# Patient Record
Sex: Female | Born: 1980 | Race: Black or African American | Hispanic: No | Marital: Married | Smoking: Never smoker
Health system: Southern US, Community
[De-identification: ages and names within clinical notes are randomized; demographics above are authoritative.]

## PROBLEM LIST (undated history)

## (undated) ENCOUNTER — Inpatient Hospital Stay (HOSPITAL_COMMUNITY): Payer: Self-pay

## (undated) DIAGNOSIS — I951 Orthostatic hypotension: Secondary | ICD-10-CM

## (undated) DIAGNOSIS — Z9289 Personal history of other medical treatment: Secondary | ICD-10-CM

## (undated) DIAGNOSIS — R000 Tachycardia, unspecified: Secondary | ICD-10-CM

## (undated) DIAGNOSIS — G90A Postural orthostatic tachycardia syndrome (POTS): Secondary | ICD-10-CM

## (undated) DIAGNOSIS — L309 Dermatitis, unspecified: Secondary | ICD-10-CM

## (undated) DIAGNOSIS — I498 Other specified cardiac arrhythmias: Secondary | ICD-10-CM

## (undated) HISTORY — DX: Tachycardia, unspecified: R00.0

## (undated) HISTORY — DX: Other specified cardiac arrhythmias: I49.8

## (undated) HISTORY — DX: Orthostatic hypotension: I95.1

## (undated) HISTORY — DX: Postural orthostatic tachycardia syndrome (POTS): G90.A

## (undated) HISTORY — DX: Dermatitis, unspecified: L30.9

## (undated) HISTORY — PX: OTHER SURGICAL HISTORY: SHX169

## (undated) HISTORY — PX: LAPAROTOMY: SHX154

## (undated) HISTORY — DX: Personal history of other medical treatment: Z92.89

## (undated) HISTORY — PX: POLYPECTOMY: SHX149

## (undated) HISTORY — PX: APPENDECTOMY: SHX54

---

## 2010-11-04 ENCOUNTER — Ambulatory Visit (INDEPENDENT_AMBULATORY_CARE_PROVIDER_SITE_OTHER): Payer: Self-pay | Admitting: General Surgery

## 2010-11-11 ENCOUNTER — Ambulatory Visit: Payer: Self-pay | Admitting: Internal Medicine

## 2010-11-15 ENCOUNTER — Encounter: Payer: Self-pay | Admitting: *Deleted

## 2010-11-18 ENCOUNTER — Ambulatory Visit (INDEPENDENT_AMBULATORY_CARE_PROVIDER_SITE_OTHER): Payer: BC Managed Care – PPO | Admitting: Internal Medicine

## 2010-11-18 ENCOUNTER — Encounter: Payer: Self-pay | Admitting: Internal Medicine

## 2010-11-18 DIAGNOSIS — G901 Familial dysautonomia [Riley-Day]: Secondary | ICD-10-CM

## 2010-11-18 DIAGNOSIS — R002 Palpitations: Secondary | ICD-10-CM | POA: Insufficient documentation

## 2010-11-18 DIAGNOSIS — G9009 Other idiopathic peripheral autonomic neuropathy: Secondary | ICD-10-CM

## 2010-11-18 DIAGNOSIS — G909 Disorder of the autonomic nervous system, unspecified: Secondary | ICD-10-CM

## 2010-11-18 DIAGNOSIS — R000 Tachycardia, unspecified: Secondary | ICD-10-CM

## 2010-11-18 NOTE — Assessment & Plan Note (Signed)
Mrs. 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 The fact that they are reproducibly induced with exercise makes a reentrant circuit much less likely and the fact that last 30 or 40 minutes and gradually slows also makes that unlikely. Given the fact that it is  reproducibly however inducible with exercise I think that this type of provocation is our  most expeditious diagnostic tool.  We spent a great deal of time discussing the physiology of this autonomic syndromes. Her orthostatic vital signs are not strikingly abnormal are strongly suggestive of dysautonomia and they may be mitigated by the fact that she is wearing heels.  She is advised to increase her salt intake, increase her fluid intake, and try to refrain from caffeine. We did note that her diastolic blood pressure was mildly elevated today

## 2010-11-18 NOTE — Assessment & Plan Note (Addendum)
As above  We will submit her for GXT and utilizing an event recorder if necessary

## 2010-11-18 NOTE — Progress Notes (Signed)
HPI: Rose Bennett is a 30 y.o. female Seen at the request of Dr. Paulino Rily because of exercise associated palpitations.  She is a 2-3 year history where she has noted palpitations during events of exercise as well as "stress" ie giving presentations at work. Her heart rates and recorded in the 170-80 range. This is now a reproducible event and induces her characteristic tachypalpitations.  She has some orthostatic intolerance. She has pool intolerance. She does not have shower intolerance. She is taking hormonal therapy to suppress Menses because of endometriosis.  Her diet is salt complete and volume status  are stable Current Outpatient Prescriptions  Medication Sig Dispense Refill  . ibuprofen (ADVIL,MOTRIN) 800 MG tablet Take 800 mg by mouth every 8 (eight) hours as needed.        . lactulose (CHRONULAC) 10 GM/15ML solution Take 20 g by mouth as needed.        Marland Kitchen letrozole (FEMARA) 2.5 MG tablet Take 2.5 mg by mouth daily.        . norethindrone (AYGESTIN) 5 MG tablet Take 5 mg by mouth daily.          Allergies  Allergen Reactions  . Heparin     Past Medical History  Diagnosis Date  . Tachycardia   . Dermatitis   . Lipoma     No past surgical history on file.  No family history on file.  History   Social History  . Marital Status: Married    Spouse Name: N/A    Number of Children: N/A  . Years of Education: N/A   Occupational History  . Not on file.   Social History Main Topics  . Smoking status: Never Smoker   . Smokeless tobacco: Not on file  . Alcohol Use: Yes     occasional  . Drug Use: No  . Sexually Active: Not on file   Other Topics Concern  . Not on file   Social History Narrative  . No narrative on file    Fourteen point review of systems was negative except as noted in HPI and PMH   PHYSICAL EXAMINATION  Blood pressure 100/60, pulse 80, resp. rate 18, height 5\' 2"  (1.575 m), weight 147 lb (66.679 kg).   Well developed and nourished [place] female appearing her stated age in no acute distress HENT normal Neck supple with JVP-flat Carotids brisk and full without bruits Back without scoliosis or kyphosis Clear Regular rate and rhythm, no murmurs or gallops Abd-soft with active BS without hepatomegaly or midline pulsation Femoral pulses 2+ distal pulses intact No Clubbing cyanosis edema Skin-warm and dry LN-neg submandibular and supraclavicular A & Oriented CN 3-12 normal  Grossly normal sensory and motor function Affect engaging . Sinus rhythm at 80 Intervals 0.13/07/0.34 Axis is 67

## 2010-11-18 NOTE — Patient Instructions (Signed)
Your physician has requested that you have an exercise tolerance test. For further information please visit [email or web address] Please also follow instruction sheet, as given.  Increase sodium and water intake.

## 2010-11-20 ENCOUNTER — Encounter (INDEPENDENT_AMBULATORY_CARE_PROVIDER_SITE_OTHER): Payer: Self-pay

## 2010-11-21 ENCOUNTER — Ambulatory Visit (INDEPENDENT_AMBULATORY_CARE_PROVIDER_SITE_OTHER): Payer: BC Managed Care – PPO | Admitting: General Surgery

## 2010-11-29 ENCOUNTER — Encounter: Payer: Self-pay | Admitting: Physician Assistant

## 2010-12-02 ENCOUNTER — Encounter (INDEPENDENT_AMBULATORY_CARE_PROVIDER_SITE_OTHER): Payer: Self-pay | Admitting: General Surgery

## 2010-12-02 ENCOUNTER — Ambulatory Visit (INDEPENDENT_AMBULATORY_CARE_PROVIDER_SITE_OTHER): Payer: BC Managed Care – PPO | Admitting: General Surgery

## 2010-12-02 VITALS — BP 118/82 | HR 96 | Temp 97.6°F | Ht 62.0 in | Wt 148.2 lb

## 2010-12-02 DIAGNOSIS — D1720 Benign lipomatous neoplasm of skin and subcutaneous tissue of unspecified limb: Secondary | ICD-10-CM

## 2010-12-02 DIAGNOSIS — D1739 Benign lipomatous neoplasm of skin and subcutaneous tissue of other sites: Secondary | ICD-10-CM

## 2010-12-02 NOTE — Progress Notes (Signed)
Subjective:     Patient ID: Rose Bennett, female   DOB: August 03, 1980, 30 y.o.   MRN: [id number]  HPI We are asked to see the patient in consultation by Dr. Mila Palmer To evaluate her for a lipoma on her left shoulder. The patient is a 30 year old black female who has noticed a lump on her left posterior shoulder/axillary area for a number of years. Over the last 6 she has noticed it getting larger. She has had some soreness associated  with it if her broad strap rubs on it or if she leans against something. She denies any fevers or chills. No chest pain or shortness of breath.  Review of Systems  Constitutional: Negative.   HENT: Negative.   Eyes: Negative.   Respiratory: Negative.   Cardiovascular: Negative.   Gastrointestinal: Negative.   Genitourinary: Negative.   Musculoskeletal: Negative.   Skin: Negative.   Neurological: Negative.   Hematological: Negative.   Psychiatric/Behavioral: Negative.    Past Medical History  Diagnosis Date  . Tachycardia   . Dermatitis   . Lipoma   . Endometriosis   . POTS (postural orthostatic tachycardia syndrome)    Past Surgical History  Procedure Date  . Laparotomy     x5  . Appendectomy   . Polypectomy     ovarian   Current outpatient prescriptions:cetirizine (ZYRTEC) 10 MG tablet, Take 10 mg by mouth daily.  , Disp: , Rfl: ;  clobetasol (TEMOVATE) 0.05 % cream, Apply 1 application topically 2 (two) times daily.  , Disp: , Rfl: ;  Cyanocobalamin (B-12 PO), Take by mouth daily.  , Disp: , Rfl: ;  diphenhydrAMINE (BENADRYL) 25 MG tablet, Take 25 mg by mouth every 6 (six) hours as needed.  , Disp: , Rfl:  Ferrous Sulfate (IRON CR PO), Take by mouth.  , Disp: , Rfl: ;  ibuprofen (ADVIL,MOTRIN) 800 MG tablet, Take 800 mg by mouth every 8 (eight) hours as needed.  , Disp: , Rfl: ;  lactulose (CHRONULAC) 10 GM/15ML solution, Take 20 g by mouth as needed.  , Disp: , Rfl: ;  letrozole (FEMARA) 2.5 MG tablet, Take 2.5 mg by mouth daily.  , Disp: ,  Rfl: ;  Multiple Vitamin (MULTIVITAMIN) capsule, Take 1 capsule by mouth daily.  , Disp: , Rfl:  norethindrone (AYGESTIN) 5 MG tablet, Take 5 mg by mouth daily.  , Disp: , Rfl: ;  oxyCODONE-acetaminophen (PERCOCET) 5-325 MG per tablet, Take 1 tablet by mouth every 4 (four) hours as needed.  , Disp: , Rfl:   Allergies  Allergen Reactions  . Heparin   Dermabond      Objective:   Physical Exam  Constitutional: She is oriented to person, place, and time. She appears well-developed and well-nourished.  HENT:  Head: Normocephalic and atraumatic.  Eyes: Conjunctivae and EOM are normal. Pupils are equal, round, and reactive to light.  Neck: Normal range of motion. Neck supple.  Cardiovascular: Normal rate, regular rhythm and normal heart sounds.   Pulmonary/Chest: Effort normal and breath sounds normal.       On her left posterior shoulder/axillary area she has about a 6 cm fatty tumor that seems to be isolated to the subcutaneous tissue  Abdominal: Soft. Bowel sounds are normal.  Musculoskeletal: Normal range of motion.  Neurological: She is alert and oriented to person, place, and time.  Skin: Skin is warm and dry.  Psychiatric: She has a normal mood and affect. Her behavior is normal.  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 I have discussed her in detail the risks and benefits of the operation remove the mass as well as some of the technical aspects and she understands and wishes to proceed.

## 2010-12-03 ENCOUNTER — Encounter (INDEPENDENT_AMBULATORY_CARE_PROVIDER_SITE_OTHER): Payer: BC Managed Care – PPO

## 2010-12-03 ENCOUNTER — Ambulatory Visit (INDEPENDENT_AMBULATORY_CARE_PROVIDER_SITE_OTHER): Payer: BC Managed Care – PPO | Admitting: Physician Assistant

## 2010-12-03 DIAGNOSIS — R000 Tachycardia, unspecified: Secondary | ICD-10-CM

## 2010-12-03 DIAGNOSIS — G9009 Other idiopathic peripheral autonomic neuropathy: Secondary | ICD-10-CM

## 2010-12-03 DIAGNOSIS — I471 Supraventricular tachycardia: Secondary | ICD-10-CM

## 2010-12-03 NOTE — Progress Notes (Addendum)
Exercise Treadmill Test  Pre-Exercise Testing Evaluation Rhythm: sinus tachycardia  Rate: 105   PR:  .12 QRS:  .08  QT:  .36 QTc: .41     Test  Exercise Tolerance Test Ordering MD: Sherryl Manges, MD  Interpreting MD:  Tereso Newcomer PA-C  Unique Test No: 1  Treadmill:  1  Indication for ETT: chest pain - rule out ischemia  Contraindication to ETT: No   Stress Modality: exercise - treadmill  Cardiac Imaging Performed: non   Protocol: standard Bruce - maximal  Max BP:  179/82  Max MPHR (bpm):  191 85% MPR (bpm):  162  MPHR obtained (bpm):  203 % MPHR obtained:  108  Reached 85% MPHR (min:sec): 3:00 Total Exercise Time (min-sec):  6:36  Workload in METS:  10.8 Borg Scale: 13  Reason ETT Terminated:  desired heart rate attained    ST Segment Analysis At Rest: normal ST segments - no evidence of significant ST depression With Exercise: no evidence of significant ST depression  Other Information Arrhythmia:  No Angina during ETT:  absent (0) Quality of ETT:  diagnostic  ETT Interpretation:  normal - no evidence of ischemia by ST analysis  Comments: Fair exercise tolerance. No chest pain. Normal BP response to exercise. No ST-T changes to suggest ischemia. No supraventricular or ventricular arrhythmias noted during exercise.  Recommendations: Follow up with Dr. Graciela Husbands as directed.  I was called back to the stress room after finishing.  The patient became near syncopal when getting up to leave.  She had associated nausea and chest pain.  No ECG was available during her episode.  She quickly felt back to normal.  These were the same symptoms she has been having that prompted her visit to Dr. Graciela Husbands.  There was nothing different about this.  She was given some crackers and juice and felt back to normal when she left.  BP was 118/80 and HR 110 when I evaluated her.  Cardiac exam was regular.  She had normal appearance and her skin was warm and dry.  I reviewed her notes and had her set up  with an event monitor today.  She was discharged to home in stable condition.

## 2010-12-24 ENCOUNTER — Ambulatory Visit (INDEPENDENT_AMBULATORY_CARE_PROVIDER_SITE_OTHER): Payer: BC Managed Care – PPO | Admitting: Internal Medicine

## 2010-12-24 ENCOUNTER — Encounter: Payer: Self-pay | Admitting: Internal Medicine

## 2010-12-24 VITALS — BP 117/75 | HR 88 | Ht 62.0 in | Wt 150.0 lb

## 2010-12-24 DIAGNOSIS — G909 Disorder of the autonomic nervous system, unspecified: Secondary | ICD-10-CM

## 2010-12-24 DIAGNOSIS — R002 Palpitations: Secondary | ICD-10-CM

## 2010-12-24 DIAGNOSIS — G901 Familial dysautonomia [Riley-Day]: Secondary | ICD-10-CM

## 2010-12-24 MED ORDER — ATENOLOL 25 MG PO TABS: 25.0000 mg | ORAL_TABLET | Freq: Every day | ORAL | Status: DC

## 2010-12-24 MED ORDER — METOPROLOL SUCCINATE ER 25 MG PO TB24: 25.0000 mg | ORAL_TABLET | Freq: Every day | ORAL | Status: DC

## 2010-12-24 MED ORDER — NADOLOL 20 MG PO TABS: 20.0000 mg | ORAL_TABLET | Freq: Every day | ORAL | Status: DC

## 2010-12-24 NOTE — Progress Notes (Signed)
HPI: Rose Bennett is a 30 y.o. female Seen seen in followup for exercise associated palpitations and post exercise syncope.  Since last having been seen, was admitted for treadmill testing with appropriate exercise response. Unfortunately I do not have the interval exercise data to look at rate of heart rate increase. After the exercise while she was standing having her leads removed she became presyncopal.  She is also exercising at the [employer]" she had an episode of post exercise syncope. Her event recorder that she has been using has demonstrated sinus tachycardia associated with nausea diaphoresis and lightheadedness   She has some orthostatic intolerance. She has pool intolerance. She does not have shower intolerance. She is taking hormonal therapy to suppress Menses because of endometriosis.  Her diet is salt complete and volume status  are stable Current Outpatient Prescriptions  Medication Sig Dispense Refill  . cetirizine (ZYRTEC) 10 MG tablet Take 10 mg by mouth daily.        . clobetasol (TEMOVATE) 0.05 % cream Apply 1 application topically 2 (two) times daily.        . Cyanocobalamin (B-12 PO) Take by mouth daily.        . diphenhydrAMINE (BENADRYL) 25 MG tablet Take 25 mg by mouth every 6 (six) hours as needed.        . Ferrous Sulfate (IRON CR PO) Take by mouth.        Marland Kitchen ibuprofen (ADVIL,MOTRIN) 800 MG tablet Take 800 mg by mouth every 8 (eight) hours as needed.        . lactulose (CHRONULAC) 10 GM/15ML solution Take 20 g by mouth as needed.        Marland Kitchen letrozole (FEMARA) 2.5 MG tablet Take 2.5 mg by mouth daily.        . Multiple Vitamin (MULTIVITAMIN) capsule Take 1 capsule by mouth daily.        . norethindrone (AYGESTIN) 5 MG tablet Take 5 mg by mouth daily.        Marland Kitchen oxyCODONE-acetaminophen (PERCOCET) 5-325 MG per tablet Take 1 tablet by mouth every 4 (four) hours as needed.          Allergies  Allergen Reactions  . Heparin     Past Medical History  Diagnosis  Date  . Tachycardia   . Dermatitis   . Lipoma   . Endometriosis   . POTS (postural orthostatic tachycardia syndrome)     Past Surgical History  Procedure Date  . Laparotomy     x5  . Appendectomy   . Polypectomy     ovarian    Family History  Problem Relation Age of Onset  . Heart disease Mother   . Heart disease Sister     History   Social History  . Marital Status: Married    Spouse Name: N/A    Number of Children: N/A  . Years of Education: N/A   Occupational History  . Not on file.   Social History Main Topics  . Smoking status: Never Smoker   . Smokeless tobacco: Not on file  . Alcohol Use: Yes     occasional  . Drug Use: No  . Sexually Active: Not on file   Other Topics Concern  . Not on file   Social History Narrative  . No narrative on file    Fourteen point review of systems was negative except as noted in HPI and PMH   PHYSICAL EXAMINATION  Height 5\' 2"  (1.575 m), weight 150 lb (  68.04 kg).   Well developed and nourished [place] American female appearing her stated age in no acute distress HENT normal Neck supple with JVP-flat Carotids brisk and full without bruits Back without scoliosis or kyphosis Clear Regular rate and rhythm, no murmurs or gallops Abd-soft with active BS without hepatomegaly or midline pulsation Femoral pulses 2+ distal pulses intact No Clubbing cyanosis edema Skin-warm and dry LN-neg submandibular and supraclavicular A & Oriented CN 3-12 normal  Grossly normal sensory and motor function Affect engaging . Sinus rhythm at 80 Intervals 0.13/07/0.34 Axis is 67

## 2010-12-24 NOTE — Patient Instructions (Addendum)
You are being given prescriptions for 3 different beta blockers to try. You may use them in any order, but DO NOT take all 3 of them at the same- 1 at a time only. You can use these in 2 week intervals to see which one you like the best. Once you decide what works best for you, then you can contact us and we can get a long term prescription in for you. You have been prescribed: 1) Atenolol 25mg  once daily. 2) Metoprolol succ 25mg  once daily. 3) Nadolol 20mg  once daily.  Your physician recommends that you schedule a follow-up appointment in: 3 months.

## 2010-12-24 NOTE — Assessment & Plan Note (Addendum)
We want to prescribe low-dose beta blockers to see if we can't temper her heart rate response to exercise. Will also have recommended from a tabs as a way to further replete her salt.  We discussed the potential role of recumbent exercise as he preferred alternative inpatient for dysautonomic

## 2011-01-17 DIAGNOSIS — D1739 Benign lipomatous neoplasm of skin and subcutaneous tissue of other sites: Secondary | ICD-10-CM

## 2011-03-20 ENCOUNTER — Other Ambulatory Visit: Payer: Self-pay | Admitting: Internal Medicine

## 2011-03-20 DIAGNOSIS — G901 Familial dysautonomia [Riley-Day]: Secondary | ICD-10-CM

## 2011-03-20 MED ORDER — METOPROLOL SUCCINATE ER 25 MG PO TB24: 25.0000 mg | ORAL_TABLET | Freq: Every day | ORAL | Status: DC

## 2011-03-20 MED ORDER — NADOLOL 20 MG PO TABS: 20.0000 mg | ORAL_TABLET | Freq: Every day | ORAL | Status: DC

## 2011-03-20 MED ORDER — ATENOLOL 25 MG PO TABS: 25.0000 mg | ORAL_TABLET | Freq: Every day | ORAL | Status: DC

## 2011-03-26 ENCOUNTER — Ambulatory Visit: Payer: BC Managed Care – PPO | Admitting: Internal Medicine

## 2011-04-29 ENCOUNTER — Encounter: Payer: Self-pay | Admitting: Internal Medicine

## 2011-07-07 ENCOUNTER — Other Ambulatory Visit (HOSPITAL_COMMUNITY): Payer: Self-pay | Admitting: Obstetrics and Gynecology

## 2011-07-07 DIAGNOSIS — Z3141 Encounter for fertility testing: Secondary | ICD-10-CM

## 2011-09-05 ENCOUNTER — Ambulatory Visit (HOSPITAL_COMMUNITY)
Admission: RE | Admit: 2011-09-05 | Discharge: 2011-09-05 | Disposition: A | Discharge status: Home / Self Care | Payer: BC Managed Care – PPO | Source: Ambulatory Visit | Attending: Obstetrics and Gynecology | Admitting: Obstetrics and Gynecology

## 2011-09-05 DIAGNOSIS — N979 Female infertility, unspecified: Secondary | ICD-10-CM | POA: Insufficient documentation

## 2011-09-05 DIAGNOSIS — Z3141 Encounter for fertility testing: Secondary | ICD-10-CM

## 2011-09-05 MED ORDER — IOHEXOL 300 MG/ML  SOLN
8.0000 mL | Freq: Once | INTRAMUSCULAR | Status: AC | PRN
  Administered 2011-09-05: 8 mL

## 2012-03-22 ENCOUNTER — Ambulatory Visit (HOSPITAL_COMMUNITY): Payer: BC Managed Care – PPO | Attending: Cardiology

## 2012-03-22 ENCOUNTER — Ambulatory Visit (INDEPENDENT_AMBULATORY_CARE_PROVIDER_SITE_OTHER): Payer: BC Managed Care – PPO | Admitting: Physician Assistant

## 2012-03-22 ENCOUNTER — Encounter: Payer: Self-pay | Admitting: Physician Assistant

## 2012-03-22 VITALS — BP 110/78 | HR 93 | Ht 62.0 in | Wt 159.8 lb

## 2012-03-22 DIAGNOSIS — R072 Precordial pain: Secondary | ICD-10-CM

## 2012-03-22 DIAGNOSIS — R5383 Other fatigue: Secondary | ICD-10-CM

## 2012-03-22 DIAGNOSIS — R5381 Other malaise: Secondary | ICD-10-CM | POA: Insufficient documentation

## 2012-03-22 DIAGNOSIS — R0602 Shortness of breath: Secondary | ICD-10-CM

## 2012-03-22 DIAGNOSIS — R079 Chest pain, unspecified: Secondary | ICD-10-CM

## 2012-03-22 DIAGNOSIS — R002 Palpitations: Secondary | ICD-10-CM | POA: Insufficient documentation

## 2012-03-22 DIAGNOSIS — R0609 Other forms of dyspnea: Secondary | ICD-10-CM | POA: Insufficient documentation

## 2012-03-22 DIAGNOSIS — R0989 Other specified symptoms and signs involving the circulatory and respiratory systems: Secondary | ICD-10-CM | POA: Insufficient documentation

## 2012-03-22 LAB — CBC WITH DIFFERENTIAL/PLATELET
Basophils Relative: 0.3 % (ref 0.0–3.0)
Eosinophils Absolute: 0 10*3/uL (ref 0.0–0.7)
Eosinophils Relative: 0.7 % (ref 0.0–5.0)
HCT: 41.4 % (ref 36.0–46.0)
Hemoglobin: 13.4 g/dL (ref 12.0–15.0)
MCHC: 32.4 g/dL (ref 30.0–36.0)
MCV: 85 fl (ref 78.0–100.0)
Monocytes Absolute: 0.3 10*3/uL (ref 0.1–1.0)
Neutro Abs: 3.8 10*3/uL (ref 1.4–7.7)
Neutrophils Relative %: 61.2 % (ref 43.0–77.0)
RBC: 4.87 Mil/uL (ref 3.87–5.11)
WBC: 6.2 10*3/uL (ref 4.5–10.5)

## 2012-03-22 LAB — BASIC METABOLIC PANEL
CO2: 24 mEq/L (ref 19–32)
Chloride: 105 mEq/L (ref 96–112)
Potassium: 3.8 mEq/L (ref 3.5–5.1)
Sodium: 137 mEq/L (ref 135–145)

## 2012-03-22 LAB — D-DIMER, QUANTITATIVE: D-Dimer, Quant: <0.27 ug/mL-FEU (ref 0.00–0.48)

## 2012-03-22 NOTE — Patient Instructions (Addendum)
Your physician recommends that you return for lab work in: TODAY BMET, CBC W/DIFF, TSH,  AND STAT D-DIMER  Your physician has requested that you have an echocardiogram DX 786.50, 786.05. Echocardiography is a painless test that uses sound waves to create images of your heart. It provides your doctor with information about the size and shape of your heart and how well your heart's chambers and valves are working. This procedure takes approximately one hour. There are no restrictions for this procedure.  Your physician has requested that you have a stress echocardiogram DX 786.50, 786.05. For further information please visit [email or web address] Please follow instruction sheet as given.  Your physician recommends that you schedule a follow-up appointment in: 1 MONTH WITH DR. Graciela Husbands

## 2012-03-22 NOTE — Progress Notes (Signed)
Echocardiogram performed.  
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
## 2012-03-23 ENCOUNTER — Encounter: Payer: Self-pay | Admitting: Physician Assistant

## 2012-03-23 ENCOUNTER — Telehealth: Payer: Self-pay | Admitting: *Deleted

## 2012-03-23 ENCOUNTER — Other Ambulatory Visit (HOSPITAL_COMMUNITY): Payer: BC Managed Care – PPO

## 2012-03-23 ENCOUNTER — Other Ambulatory Visit: Payer: Self-pay | Admitting: *Deleted

## 2012-03-23 DIAGNOSIS — R0602 Shortness of breath: Secondary | ICD-10-CM

## 2012-03-23 DIAGNOSIS — R079 Chest pain, unspecified: Secondary | ICD-10-CM

## 2012-03-23 DIAGNOSIS — R5383 Other fatigue: Secondary | ICD-10-CM

## 2012-03-23 NOTE — Telephone Encounter (Signed)
lmom echo and labs ok,

## 2012-03-23 NOTE — Telephone Encounter (Signed)
Left message for patient to call and schedule GXT per scott.

## 2012-03-23 NOTE — Telephone Encounter (Signed)
Message copied by Tarri Fuller on Tue Mar 23, 2012  9:53 AM ------      Message from: [place], [place] T      Created: Tue Mar 23, 2012  8:14 AM       Echo ok with      Normal LV function.      Tereso Newcomer, PA-C 03/23/2012 8:14 AM

## 2012-04-12 ENCOUNTER — Encounter: Payer: BC Managed Care – PPO | Admitting: Physician Assistant

## 2012-04-16 ENCOUNTER — Telehealth: Payer: Self-pay | Admitting: *Deleted

## 2012-04-16 NOTE — Telephone Encounter (Signed)
Pt cancel GXT

## 2012-04-16 NOTE — Telephone Encounter (Signed)
Rose Bennett was a no-show for her treadmill appointment on 04/12/12 ordered by Tereso Newcomer PA. Left message on voice mail for patient to call and reschedule.

## 2012-05-18 ENCOUNTER — Ambulatory Visit: Payer: BC Managed Care – PPO | Admitting: Internal Medicine

## 2012-06-23 ENCOUNTER — Telehealth: Payer: Self-pay | Admitting: Internal Medicine

## 2012-06-23 DIAGNOSIS — G901 Familial dysautonomia [Riley-Day]: Secondary | ICD-10-CM

## 2012-06-23 MED ORDER — NADOLOL 20 MG PO TABS: 20.0000 mg | ORAL_TABLET | Freq: Every day | ORAL | Status: DC

## 2012-06-23 NOTE — Telephone Encounter (Signed)
Verifying with patient that beta blocker is Nadolol 20 once daily.  Refill sent to [facility] [address]  [phone], 3 refills   Micki Riley, [place]

## 2012-06-23 NOTE — Telephone Encounter (Signed)
Pt needs refill asap completely out of all three beta blockers, no refills left, uses [facility] [address]

## 2012-10-28 ENCOUNTER — Emergency Department (INDEPENDENT_AMBULATORY_CARE_PROVIDER_SITE_OTHER)
Admission: EM | Admit: 2012-10-28 | Discharge: 2012-10-28 | Disposition: A | Discharge status: Home / Self Care | Payer: BC Managed Care – PPO | Source: Home / Self Care | Attending: Family Medicine | Admitting: Family Medicine

## 2012-10-28 ENCOUNTER — Other Ambulatory Visit (HOSPITAL_COMMUNITY)
Admission: RE | Admit: 2012-10-28 | Discharge: 2012-10-28 | Disposition: A | Discharge status: Home / Self Care | Payer: BC Managed Care – PPO | Source: Ambulatory Visit | Attending: Family Medicine | Admitting: Family Medicine

## 2012-10-28 ENCOUNTER — Encounter (HOSPITAL_COMMUNITY): Payer: Self-pay | Admitting: Emergency Medicine

## 2012-10-28 DIAGNOSIS — R42 Dizziness and giddiness: Secondary | ICD-10-CM

## 2012-10-28 DIAGNOSIS — Z113 Encounter for screening for infections with a predominantly sexual mode of transmission: Secondary | ICD-10-CM | POA: Insufficient documentation

## 2012-10-28 DIAGNOSIS — N898 Other specified noninflammatory disorders of vagina: Secondary | ICD-10-CM

## 2012-10-28 DIAGNOSIS — N939 Abnormal uterine and vaginal bleeding, unspecified: Secondary | ICD-10-CM

## 2012-10-28 DIAGNOSIS — N760 Acute vaginitis: Secondary | ICD-10-CM | POA: Insufficient documentation

## 2012-10-28 LAB — POCT URINALYSIS DIP (DEVICE)
Bilirubin Urine: NEGATIVE
Glucose, UA: NEGATIVE mg/dL
Specific Gravity, Urine: >=1.03 (ref 1.005–1.030)
Urobilinogen, UA: 0.2 mg/dL (ref 0.0–1.0)
pH: 6 (ref 5.0–8.0)

## 2012-10-28 LAB — POCT I-STAT, CHEM 8
BUN: 11 mg/dL (ref 6–23)
Calcium, Ion: 1.21 mmol/L (ref 1.12–1.23)
Chloride: 103 mEq/L (ref 96–112)
Creatinine, Ser: 0.9 mg/dL (ref 0.50–1.10)
TCO2: 24 mmol/L (ref 0–100)

## 2012-10-28 MED ORDER — NORGESTIMATE-ETH ESTRADIOL 0.25-35 MG-MCG PO TABS: ORAL_TABLET | ORAL | Status: DC

## 2012-10-28 MED ORDER — ONDANSETRON 4 MG PO TBDP: ORAL_TABLET | ORAL | Status: DC

## 2012-10-28 MED ORDER — TRAMADOL HCL 50 MG PO TABS: 50.0000 mg | ORAL_TABLET | Freq: Four times a day (QID) | ORAL | Status: DC | PRN

## 2012-10-28 NOTE — ED Provider Notes (Signed)
History    CSN: [id number] Arrival date & time 10/28/12  1018  First MD Initiated Contact with Patient 10/28/12 1117     Chief Complaint  Patient presents with  . Vaginal Bleeding   (Consider location/radiation/quality/duration/timing/severity/associated sxs/prior Treatment) HPI Comments: 32 year old female with a history of endometriosis presents complaining of vaginal bleeding for 4 days. She was going through 2 pads and 2 tampons every hour yesterday but is down to 1 tampons and 1 pad hourly today. She says this is a mixture of dark blood and clots. She has been getting very dizzy to the point where she fainted at work yesterday. She states that she started feeling very hot and fell backward off of her chair. Since then, she has been feeling very lightheaded. She also has had a headache in the front of her head since this bleeding started and a mild soreness on the back of her head since she fell out of her chair when she fainted. She states that in the past couple of days she feels like she has to concentrate very hard while driving to keep from getting distracted and from fainting. She called her physician at [facility] at their endometriosis clinic who told her to come up to be seen to check for any significant bleeding or anemia as a result of this. She denies all  other symptoms. She has chronic lower abdominal/pelvic pain and this is unchanged. She denies chest pain, shortness of breath, blurry vision.  Patient is a 32 y.o. female presenting with vaginal bleeding.  Vaginal Bleeding Associated symptoms: dizziness and fatigue   Associated symptoms: no abdominal pain, no dysuria, no fever, no nausea and no vaginal discharge    Past Medical History  Diagnosis Date  . Tachycardia   . Dermatitis   . Lipoma   . Endometriosis   . POTS (postural orthostatic tachycardia syndrome)   . Hx of echocardiogram     a. echo 12/13:  mild LVH, EF 55%, normal diast fn   Past Surgical History  Procedure  Laterality Date  . Laparotomy      x5  . Appendectomy    . Polypectomy      ovarian   Family History  Problem Relation Age of Onset  . Heart disease Mother   . Heart disease Sister    History  Substance Use Topics  . Smoking status: Never Smoker   . Smokeless tobacco: Not on file  . Alcohol Use: Yes     Comment: occasional   OB History   Grav Para Term Preterm Abortions TAB SAB Ect Mult Living                 Review of Systems  Constitutional: Positive for fatigue. Negative for fever and chills.  Eyes: Negative for visual disturbance.  Respiratory: Negative for cough and shortness of breath.   Cardiovascular: Negative for chest pain, palpitations and leg swelling.  Gastrointestinal: Negative for nausea, vomiting and abdominal pain.  Endocrine: Negative for polydipsia and polyuria.  Genitourinary: Positive for vaginal bleeding and pelvic pain (chronic). Negative for dysuria, urgency, frequency, vaginal discharge and vaginal pain.  Musculoskeletal: Negative for myalgias and arthralgias.  Skin: Negative for rash.  Neurological: Positive for dizziness, light-headedness and headaches. Negative for weakness.    Allergies  Adhesive; Heparin; Pineapple; and Tomato  Home Medications   Current Outpatient Rx  Name  Route  Sig  Dispense  Refill  . letrozole (FEMARA) 2.5 MG tablet   Oral   Take  2.5 mg by mouth daily.           . norethindrone (AYGESTIN) 5 MG tablet   Oral   Take 5 mg by mouth daily.           . cetirizine (ZYRTEC) 10 MG tablet   Oral   Take 10 mg by mouth as needed.          . clobetasol (TEMOVATE) 0.05 % cream   Topical   Apply 1 application topically 2 (two) times daily.           . Cyanocobalamin (B-12 PO)   Oral   Take by mouth daily.           . diphenhydrAMINE (BENADRYL) 25 MG tablet   Oral   Take 25 mg by mouth every 6 (six) hours as needed.           . Ferrous Sulfate (IRON CR PO)   Oral   Take by mouth.           Marland Kitchen  ibuprofen (ADVIL,MOTRIN) 800 MG tablet   Oral   Take 800 mg by mouth every 8 (eight) hours as needed.           . lactulose (CHRONULAC) 10 GM/15ML solution   Oral   Take 20 g by mouth as needed.           . Multiple Vitamin (MULTIVITAMIN) capsule   Oral   Take 1 capsule by mouth daily.           . nadolol (CORGARD) 20 MG tablet   Oral   Take 1 tablet (20 mg total) by mouth daily.   30 tablet   3   . norgestimate-ethinyl estradiol (ORTHO-CYCLEN,SPRINTEC,PREVIFEM) 0.25-35 MG-MCG tablet      1 tablet every 6 hours today 1 tablet every 8 on day 2  1 tablet BID until bleeding stops  1 tablet per day until pack is finished  Unless otherwise directed by OBGYN physician   1 Package   11   . ondansetron (ZOFRAN-ODT) 4 MG disintegrating tablet      PRN for nausea or vomiting; take 1 tablet sublingual along with the birth control pill   30 tablet   0   . oxyCODONE-acetaminophen (PERCOCET) 5-325 MG per tablet   Oral   Take 1 tablet by mouth every 4 (four) hours as needed.           . traMADol (ULTRAM) 50 MG tablet   Oral   Take 1 tablet (50 mg total) by mouth every 6 (six) hours as needed for pain.   30 tablet   0    BP 127/73  Pulse 90  Temp(Src) 98 F (36.7 C) (Oral)  Resp 12  SpO2 100% Physical Exam  Nursing note and vitals reviewed. Constitutional: She is oriented to person, place, and time. Vital signs are normal. She appears well-developed and well-nourished. No distress.  HENT:  Head: Normocephalic and atraumatic.  Mouth/Throat: Mucous membranes are not cyanotic (no pallor).  Eyes: EOM are normal. Pupils are equal, round, and reactive to light.  No conjunctival pallor  Cardiovascular: Normal rate, regular rhythm and normal heart sounds.  Exam reveals no gallop and no friction rub.   No murmur heard. Pulmonary/Chest: Effort normal and breath sounds normal. No respiratory distress. She has no wheezes. She has no rales.  Abdominal: Soft. There is no  tenderness.  Genitourinary: Vagina normal. Pelvic exam was performed with patient supine. Cervix exhibits no discharge.  No erythema or bleeding around the vagina. No foreign body around the vagina. No signs of injury around the vagina. No vaginal discharge found.  Neurological: She is alert and oriented to person, place, and time. She has normal strength.  Skin: Skin is warm and dry. She is not diaphoretic.  Psychiatric: She has a normal mood and affect. Her behavior is normal. Judgment normal.    ED Course  Procedures (including critical care time) Labs Reviewed  POCT I-STAT, CHEM 8 - Abnormal; Notable for the following:    Hemoglobin 15.3 (*)    All other components within normal limits  POCT URINALYSIS DIP (DEVICE) - Abnormal; Notable for the following:    Ketones, ur 15 (*)    Hgb urine dipstick MODERATE (*)    All other components within normal limits  POCT PREGNANCY, URINE  CERVICOVAGINAL ANCILLARY ONLY   No results found. 1. Vaginal bleeding   2. Dizziness     MDM   Case discussed with attending. Treat with OCPs taper. Push PO fluids. Followup with OB tomorrow as scheduled. If the bleeding gets heavier, but the dizziness gets worse, or she starts to have any other worsening symptoms associated this she will go to the emergency department or [facility]'s hospital.  Meds ordered this encounter  Medications  . norgestimate-ethinyl estradiol (ORTHO-CYCLEN,SPRINTEC,PREVIFEM) 0.25-35 MG-MCG tablet    Sig: 1 tablet every 6 hours today 1 tablet every 8 on day 2  1 tablet BID until bleeding stops  1 tablet per day until pack is finished  Unless otherwise directed by OBGYN physician    Dispense:  1 Package    Refill:  11  . ondansetron (ZOFRAN-ODT) 4 MG disintegrating tablet    Sig: PRN for nausea or vomiting; take 1 tablet sublingual along with the birth control pill    Dispense:  30 tablet    Refill:  0  . traMADol (ULTRAM) 50 MG tablet    Sig: Take 1 tablet (50 mg total) by  mouth every 6 (six) hours as needed for pain.    Dispense:  30 tablet    Refill:  0     Graylon Good, PA-C 10/28/12 2055

## 2012-10-28 NOTE — ED Notes (Signed)
Patient could not void at this time 

## 2012-10-28 NOTE — ED Notes (Signed)
Pt c/o abnormal vaginal bleeding x 4 days. Has been diagnosed with endometriosis and had surgery to stop her menstrual cycle. Since she has been bleeding pt has felt dizzy and fatigued. Feels very nauseous so she has not been eating. Pt reports yesterday she passed out. Feels very weak and has a headache. Patient is alert and oriented.

## 2012-10-29 NOTE — ED Provider Notes (Signed)
Medical screening examination/treatment/procedure(s) were performed by non-physician practitioner and as supervising physician I was immediately available for consultation/collaboration.   MORENO-COLL,Rosemary Pentecost; MD  Ina Scrivens Moreno-Coll, MD 10/29/12 0758 

## 2013-09-26 ENCOUNTER — Inpatient Hospital Stay (HOSPITAL_COMMUNITY): Payer: Managed Care, Other (non HMO)

## 2013-09-26 ENCOUNTER — Encounter (HOSPITAL_COMMUNITY): Payer: Self-pay

## 2013-09-26 ENCOUNTER — Inpatient Hospital Stay (HOSPITAL_COMMUNITY)
Admission: AD | Admit: 2013-09-26 | Discharge: 2013-09-26 | Disposition: A | Discharge status: Home / Self Care | Payer: Managed Care, Other (non HMO) | Source: Ambulatory Visit | Attending: Obstetrics and Gynecology | Admitting: Obstetrics and Gynecology

## 2013-09-26 DIAGNOSIS — N939 Abnormal uterine and vaginal bleeding, unspecified: Secondary | ICD-10-CM

## 2013-09-26 DIAGNOSIS — O30009 Twin pregnancy, unspecified number of placenta and unspecified number of amniotic sacs, unspecified trimester: Secondary | ICD-10-CM | POA: Insufficient documentation

## 2013-09-26 DIAGNOSIS — N898 Other specified noninflammatory disorders of vagina: Secondary | ICD-10-CM

## 2013-09-26 DIAGNOSIS — Z6791 Unspecified blood type, Rh negative: Secondary | ICD-10-CM

## 2013-09-26 DIAGNOSIS — O26891 Other specified pregnancy related conditions, first trimester: Secondary | ICD-10-CM

## 2013-09-26 DIAGNOSIS — O209 Hemorrhage in early pregnancy, unspecified: Secondary | ICD-10-CM | POA: Insufficient documentation

## 2013-09-26 LAB — ABO/RH: ABO/RH(D): A NEG

## 2013-09-26 LAB — WET PREP, GENITAL
Clue Cells Wet Prep HPF POC: NONE SEEN
Trich, Wet Prep: NONE SEEN
Yeast Wet Prep HPF POC: NONE SEEN

## 2013-09-26 LAB — URINALYSIS, ROUTINE W REFLEX MICROSCOPIC
BILIRUBIN URINE: NEGATIVE
Glucose, UA: NEGATIVE mg/dL
KETONES UR: NEGATIVE mg/dL
Leukocytes, UA: NEGATIVE
NITRITE: NEGATIVE
PROTEIN: NEGATIVE mg/dL
UROBILINOGEN UA: 0.2 mg/dL (ref 0.0–1.0)
pH: 6 (ref 5.0–8.0)

## 2013-09-26 LAB — URINE MICROSCOPIC-ADD ON

## 2013-09-26 LAB — CBC
HCT: 36.4 % (ref 36.0–46.0)
HEMOGLOBIN: 12.3 g/dL (ref 12.0–15.0)
MCH: 28.5 pg (ref 26.0–34.0)
MCHC: 33.8 g/dL (ref 30.0–36.0)
MCV: 84.5 fL (ref 78.0–100.0)
Platelets: 234 10*3/uL (ref 150–400)
RBC: 4.31 MIL/uL (ref 3.87–5.11)
RDW: 12.9 % (ref 11.5–15.5)
WBC: 11 10*3/uL — AB (ref 4.0–10.5)

## 2013-09-26 MED ORDER — RHO D IMMUNE GLOBULIN 1500 UNIT/2ML IJ SOSY
300.0000 ug | PREFILLED_SYRINGE | Freq: Once | INTRAMUSCULAR | Status: AC
  Administered 2013-09-26: 300 ug via INTRAMUSCULAR
  Filled 2013-09-26: qty 2

## 2013-09-26 NOTE — MAU Provider Note (Signed)
None     Chief Complaint:  Vaginal Bleeding   Rose Bennett is  33 y.o. G1P0 at [redacted]w[redacted]d presents complaining of vaginal bleeding that started this afternoon. Pt states she had Bright red blood when wiping for 4 tissue papers. She has been having some cramping for the last several days associated with some low back pain. Pt states that it has been mild and has not been concerned. Reports she is RH neg as well.  Denies lof or known ctx  This is an IVF twin pregnancy on the first cycle. Hx of endometriosis and difficulty with fertility.   Obstetrical/Gynecological History: OB History   Grav Para Term Preterm Abortions TAB SAB Ect Mult Living   1              Past Medical History: Past Medical History  Diagnosis Date  . Tachycardia   . Dermatitis   . Lipoma   . Endometriosis   . POTS (postural orthostatic tachycardia syndrome)   . Hx of echocardiogram     a. echo 12/13:  mild LVH, EF 55%, normal diast fn    Past Surgical History: Past Surgical History  Procedure Laterality Date  . Laparotomy      x5  . Appendectomy    . Polypectomy      ovarian  . Laporoscopy      seven surgeries    Family History: Family History  Problem Relation Age of Onset  . Heart disease Mother   . Heart disease Sister     Social History: History  Substance Use Topics  . Smoking status: Never Smoker   . Smokeless tobacco: Not on file  . Alcohol Use: Yes     Comment: occasional    Allergies:  Allergies  Allergen Reactions  . Adhesive [Tape] Itching and Swelling  . Heparin Swelling  . Pineapple Hives and Rash  . Tomato Hives and Rash    Meds:  Prescriptions prior to admission  Medication Sig Dispense Refill  . Estradiol (MINIVELLE TD) Place 2 patches onto the skin once a week. Change both patches every Tuesday and Saturday. Patches are from [facility] in [place].      . Prenatal Vit-Fe Fumarate-FA (PRENATAL MULTIVITAMIN) TABS tablet Take 1 tablet by mouth daily at 12  noon.      Marland Kitchen PROGESTERONE, VAGINAL, (CRINONE) 8 % GEL Place 1 application vaginally daily.        Review of Systems -   Review of Systems  Constitutional: Negative for fever, chills, weight loss, malaise/fatigue and diaphoresis.  HENT: Negative for hearing loss, ear pain, nosebleeds, congestion, sore throat, neck pain, tinnitus and ear discharge.   Eyes: Negative for blurred vision, double vision, photophobia, pain, discharge and redness.  Respiratory: Negative for cough, hemoptysis, sputum production, shortness of breath, wheezing and stridor.   Cardiovascular: Negative for chest pain, palpitations, orthopnea,  leg swelling  Gastrointestinal: Negative for abdominal pain heartburn, nausea, vomiting, diarrhea, constipation, blood in stool Genitourinary: Negative for dysuria, urgency, frequency, hematuria and flank pain.  Musculoskeletal: Negative for myalgias, back pain, joint pain and falls.  Skin: Negative for itching and rash.  Neurological: Negative for dizziness, tingling, tremors, sensory change, speech change, focal weakness, seizures, loss of consciousness, weakness and headaches.  Endo/Heme/Allergies: Negative for environmental allergies and polydipsia. Does not bruise/bleed easily.  Psychiatric/Behavioral: Negative for depression, suicidal ideas, hallucinations, memory loss and substance abuse. The patient is not nervous/anxious and does not have insomnia.      Physical Exam  Blood pressure 127/71, pulse 91, temperature 99.4 F (37.4 C), temperature source Oral, resp. rate 16, height 5\' 2"  (1.575 m), weight 78.019 kg (172 lb), SpO2 100.00%. GENERAL: Well-developed, well-nourished female in no acute distress.  LUNGS: Clear to auscultation bilaterally.  HEART: Regular rate and rhythm. ABDOMEN: Soft, minimally tender suprapubically and LLQ. No rebound. No epigastic pain, neg murphys  EXTREMITIES: Nontender, no edema      Labs: Results for orders placed during the hospital  encounter of 09/26/13 (from the past 24 hour(s))  URINALYSIS, ROUTINE W REFLEX MICROSCOPIC   Collection Time    09/26/13  6:53 PM      Result Value Ref Range   Color, Urine YELLOW  YELLOW   APPearance CLEAR  CLEAR   Specific Gravity, Urine <1.005 (*) 1.005 - 1.030   pH 6.0  5.0 - 8.0   Glucose, UA NEGATIVE  NEGATIVE mg/dL   Hgb urine dipstick LARGE (*) NEGATIVE   Bilirubin Urine NEGATIVE  NEGATIVE   Ketones, ur NEGATIVE  NEGATIVE mg/dL   Protein, ur NEGATIVE  NEGATIVE mg/dL   Urobilinogen, UA 0.2  0.0 - 1.0 mg/dL   Nitrite NEGATIVE  NEGATIVE   Leukocytes, UA NEGATIVE  NEGATIVE  URINE MICROSCOPIC-ADD ON   Collection Time    09/26/13  6:53 PM      Result Value Ref Range   Squamous Epithelial / LPF RARE  RARE   WBC, UA 0-2  <3 WBC/hpf   RBC / HPF 0-2  <3 RBC/hpf   Bacteria, UA RARE  RARE  CBC   Collection Time    09/26/13  7:16 PM      Result Value Ref Range   WBC 11.0 (*) 4.0 - 10.5 K/uL   RBC 4.31  3.87 - 5.11 MIL/uL   Hemoglobin 12.3  12.0 - 15.0 g/dL   HCT 36.4  36.0 - 46.0 %   MCV 84.5  78.0 - 100.0 fL   MCH 28.5  26.0 - 34.0 pg   MCHC 33.8  30.0 - 36.0 g/dL   RDW 12.9  11.5 - 15.5 %   Platelets 234  150 - 400 K/uL  ABO/RH   Collection Time    09/26/13  7:16 PM      Result Value Ref Range   ABO/RH(D) A NEG    WET PREP, GENITAL   Collection Time    09/26/13  7:45 PM      Result Value Ref Range   Yeast Wet Prep HPF POC NONE SEEN  NONE SEEN   Trich, Wet Prep NONE SEEN  NONE SEEN   Clue Cells Wet Prep HPF POC NONE SEEN  NONE SEEN   WBC, Wet Prep HPF POC FEW (*) NONE SEEN   Imaging Studies:  CLINICAL DATA: Pregnant, status post IVF on 08/15/2013, vaginal  bleeding  EXAM:  TWIN OBSTETRIC <14WK [place] AND TRANSVAGINAL OB US  COMPARISON: None.  FINDINGS:  TWIN 1  Intrauterine gestational sac: Visualized/normal in shape.  Yolk sac: Present  Embryo: Present  Cardiac Activity: Present  Heart Rate: 175 bpm  CRL: 19.9 mm 8 w 4 d [place] EDC: 05/04/2014  TWIN 2   Intrauterine gestational sac: Visualized/normal in shape.  Yolk sac: Present  Embryo: Present  Cardiac Activity: Present  Heart Rate: 178 bpm  CRL: 20.8 mm 8 w 5 d [place] EDC: 05/03/2014  Maternal uterus/adnexae: No subchronic hemorrhage.  Right ovary is within normal limits.  Left ovary is not discretely visualized  No free fluid.  IMPRESSION:  Twin live intrauterine gestations, as above, with estimated  gestational age 110 weeks 5 days by crown-rump length.  Electronically Signed  By: Julian Hy M.D.  On: 09/26/2013 20:28     Assessment: Rose Bennett is  33 y.o. G1P0 at [redacted]w[redacted]d presents with vaginal bleeding in first trimester.  Wet mount is reassuring GC/C Pending at time of discharge  US Shows no evidence of subchorionic hemorrhage or ectopic pregnancy  Rhogam given to Rh Neg pt with bleeding  Continue care with Primary OB - Dr. Landry Mellow - discussed with Dr. Landry Mellow this evening.   Allen Norris 6/8/20158:32 PM

## 2013-09-26 NOTE — MAU Note (Signed)
Patient states she had been to [facility] and knows that she has a twin pregnancy. States for the last couple of days she has had a headache off and on. This afternoon she had bright red blood on the tissue with wiping. Got less with wiping and on arrival only has a slight stain on the panties, no active bleeding. Slight cramping.

## 2013-09-26 NOTE — MAU Note (Signed)
Patient state she has a negative blood type and will need Rhophylac with bleeding.

## 2013-09-26 NOTE — Discharge Instructions (Signed)
Vaginal Bleeding During Pregnancy  A small amount of bleeding from the vagina can happen anytime during pregnancy. It usually stops on its own. However, some bleeding can be serious. Be sure to tell your doctor about all vaginal bleeding.  HOME CARE   Get plenty of rest and sleep.   Stay in bed and only get up to go to the bathroom as told by your doctor.   Write down the number of pads you use each day. Note how soaked they are.   Do not use tampons. Do not clean the vagina with a stream of water (douche).   Do not have sex (intercourse) or put anything into your vagina. Have this approved by your doctor.   Save any tissue that comes from your vagina. Show it to your doctor.   Only take medicine as told by your doctor.   Follow your doctor's advice about lifting, driving, and physical activity.  GET HELP RIGHT AWAY IF:    You feel your baby move less or not at all.   You pass out (faint) while going to the bathroom.   You have more bleeding.   You start to have contractions.   You have severe cramps in your stomach, back, or belly (abdomen).   You are leaking fluid or have a gush of fluid from your vagina.   You become lightheaded or weak.   You have chills.   You have clumps of tissue or blood clots coming from your vagina.   You have a fever.  MAKE SURE YOU:    Understand these instructions.   Will watch your condition.   Will get help right away if you are not doing well or get worse.  Document Released: 01/15/2008 Document Revised: 03/24/2012 Document Reviewed: 01/26/2012  Exit[employer] Patient Information 2014 [employer].

## 2013-09-27 LAB — RH IG WORKUP (INCLUDES ABO/RH)
ABO/RH(D): A NEG
ANTIBODY SCREEN: NEGATIVE
GESTATIONAL AGE(WKS): 8
Unit division: 0

## 2013-09-27 LAB — GC/CHLAMYDIA PROBE AMP
CT Probe RNA: NEGATIVE
GC PROBE AMP APTIMA: NEGATIVE

## 2013-10-03 ENCOUNTER — Inpatient Hospital Stay (HOSPITAL_COMMUNITY)
Admission: AD | Admit: 2013-10-03 | Discharge: 2013-10-03 | Disposition: A | Discharge status: Home / Self Care | Payer: Managed Care, Other (non HMO) | Source: Ambulatory Visit | Attending: Obstetrics and Gynecology | Admitting: Obstetrics and Gynecology

## 2013-10-03 ENCOUNTER — Encounter (HOSPITAL_COMMUNITY): Payer: Self-pay | Admitting: *Deleted

## 2013-10-03 DIAGNOSIS — Z87891 Personal history of nicotine dependence: Secondary | ICD-10-CM | POA: Insufficient documentation

## 2013-10-03 DIAGNOSIS — O30009 Twin pregnancy, unspecified number of placenta and unspecified number of amniotic sacs, unspecified trimester: Secondary | ICD-10-CM | POA: Insufficient documentation

## 2013-10-03 DIAGNOSIS — O209 Hemorrhage in early pregnancy, unspecified: Secondary | ICD-10-CM

## 2013-10-03 NOTE — MAU Note (Signed)
Was at work, felt something- went to the bathroom, blood was just running.  Denies any pain

## 2013-10-03 NOTE — Discharge Instructions (Signed)
° °Pelvic Rest °Pelvic rest is sometimes recommended for women when:  °· The placenta is partially or completely covering the opening of the cervix (placenta previa). °· There is bleeding between the uterine wall and the amniotic sac in the first trimester (subchorionic hemorrhage). °· The cervix begins to open without labor starting (incompetent cervix, cervical insufficiency). °· The labor is too early (preterm labor). °HOME CARE INSTRUCTIONS °· Do not have sexual intercourse, stimulation, or an orgasm. °· Do not use tampons, douche, or put anything in the vagina. °· Do not lift anything over 10 pounds (4.5 kg). °· Avoid strenuous activity or straining your pelvic muscles. °SEEK MEDICAL CARE IF:  °· You have any vaginal bleeding during pregnancy. Treat this as a potential emergency. °· You have cramping pain felt low in the stomach (stronger than menstrual cramps). °· You notice vaginal discharge (watery, mucus, or bloody). °· You have a low, dull backache. °· There are regular contractions or uterine tightening. °SEEK IMMEDIATE MEDICAL CARE IF: °You have vaginal bleeding and have placenta previa.  °Document Released: 08/02/2010 Document Revised: 06/30/2011 Document Reviewed: 08/02/2010 °Exit[employer]® Patient Information ©2014 [employer]. ° °Vaginal Bleeding During Pregnancy, First Trimester °A small amount of bleeding (spotting) from the vagina is relatively common in early pregnancy. It usually stops on its own. Various things may cause bleeding or spotting in early pregnancy. Some bleeding may be related to the pregnancy, and some may not. In most cases, the bleeding is normal and is not a problem. However, bleeding can also be a sign of something serious. Be sure to tell your health care provider about any vaginal bleeding right away. °Some possible causes of vaginal bleeding during the first trimester include: °· Infection or inflammation of the cervix. °· Growths (polyps) on the cervix. °· Miscarriage or  threatened miscarriage. °· Pregnancy tissue has developed outside of the uterus and in a fallopian tube (tubal pregnancy). °· Tiny cysts have developed in the uterus instead of pregnancy tissue (molar pregnancy). °HOME CARE INSTRUCTIONS  °Watch your condition for any changes. The following actions may help to lessen any discomfort you are feeling: °· Follow your health care provider's instructions for limiting your activity. If your health care provider orders bed rest, you may need to stay in bed and only get up to use the bathroom. However, your health care provider may allow you to continue light activity. °· If needed, make plans for someone to help with your regular activities and responsibilities while you are on bed rest. °· Keep track of the number of pads you use each day, how often you change pads, and how soaked (saturated) they are. Write this down. °· Do not use tampons. Do not douche. °· Do not have sexual intercourse or orgasms until approved by your health care provider. °· If you pass any tissue from your vagina, save the tissue so you can show it to your health care provider. °· Only take over-the-counter or prescription medicines as directed by your health care provider. °· Do not take aspirin because it can make you bleed. °· Keep all follow-up appointments as directed by your health care provider. °SEEK MEDICAL CARE IF: °· You have any vaginal bleeding during any part of your pregnancy. °· You have cramps or labor pains. °SEEK IMMEDIATE MEDICAL CARE IF:  °· You have severe cramps in your back or belly (abdomen). °· You have a fever, not controlled by medicine. °· You pass large clots or tissue from your vagina. °· Your bleeding   increases. °· You feel lightheaded or weak, or you have fainting episodes. °· You have chills. °· You are leaking fluid or have a gush of fluid from your vagina. °· You pass out while having a bowel movement. °MAKE SURE YOU: °· Understand these instructions. °· Will watch  your condition. °· Will get help right away if you are not doing well or get worse. °Document Released: 01/15/2005 Document Revised: 01/26/2013 Document Reviewed: 12/13/2012 °ExitCare® Patient Information ©2014 [employer]. ° °

## 2013-10-03 NOTE — MAU Provider Note (Signed)
History     CSN: [id number]  Arrival date and time: 10/03/13 1419   First Provider Initiated Contact with Patient 10/03/13 1434      Chief Complaint  Patient presents with  . Vaginal Bleeding   HPI [name] is a 33 y.o. G1P0 at [redacted]w[redacted]d with twin gestation who presents to MAU today with complaint of vaginal bleeding. The patient was seen on 09/26/13 and had US showing twin IUP without [facility]. She states that bleeding became heavy this afternoon but has since stopped. She denies abdominal pain or fever. She states occasional mild N/V.   OB History   Grav Para Term Preterm Abortions TAB SAB Ect Mult Living   1               Past Medical History  Diagnosis Date  . Tachycardia   . Dermatitis   . Lipoma   . Endometriosis   . POTS (postural orthostatic tachycardia syndrome)   . Hx of echocardiogram     a. echo 12/13:  mild LVH, EF 55%, normal diast fn    Past Surgical History  Procedure Laterality Date  . Laparotomy      x5  . Appendectomy    . Polypectomy      ovarian  . Laporoscopy      seven surgeries    Family History  Problem Relation Age of Onset  . Heart disease Mother   . Heart disease Sister     History  Substance Use Topics  . Smoking status: Never Smoker   . Smokeless tobacco: Not on file  . Alcohol Use: Yes     Comment: occasional    Allergies:  Allergies  Allergen Reactions  . Adhesive [Tape] Itching and Swelling  . Heparin Swelling  . Pineapple Hives and Rash  . Tomato Hives and Rash    Prescriptions prior to admission  Medication Sig Dispense Refill  . Prenatal Vit-Fe Fumarate-FA (PRENATAL MULTIVITAMIN) TABS tablet Take 1 tablet by mouth daily at 12 noon.      Marland Kitchen PROGESTERONE, VAGINAL, (CRINONE) 8 % GEL Place 1 application vaginally daily.      . Estradiol (MINIVELLE TD) Place 2 patches onto the skin once a week. Change both patches every Tuesday and Saturday. Patches are from [facility] in [place].        Review of  Systems  Constitutional: Negative for fever.  Gastrointestinal: Positive for nausea and vomiting. Negative for abdominal pain.  Genitourinary:       + vaginal bleeding   Physical Exam   Blood pressure 134/75, pulse 98, temperature 99.1 F (37.3 C), temperature source Oral, resp. rate 16, height 5\' 2"  (1.575 m), weight 173 lb (78.472 kg), SpO2 100.00%.  Physical Exam  Constitutional: She is oriented to person, place, and time. She appears well-developed and well-nourished. No distress.  HENT:  Head: Normocephalic.  Cardiovascular: Normal rate.   Respiratory: Effort normal.  GI: Soft. She exhibits no distension and no mass. There is tenderness (mild tenderness to palpation of the lower abdomen). There is no rebound and no guarding.  Genitourinary: Cervix exhibits no motion tenderness, no discharge and no friability. There is bleeding (scant brown blood noted in the vaginal vault) around the vagina. No vaginal discharge found.  Cervix: closed, thick  Neurological: She is alert and oriented to person, place, and time.  Skin: Skin is warm and dry. No erythema.  Psychiatric: She has a normal mood and affect.     MAU Course  Procedures None  MDM Discussed with Dr. Nelda Marseille. Patient needs office follow-up within 2 weeks.   Assessment and Plan  A: Vaginal bleeding in pregnancy prior to [redacted] weeks gestation  P: Discharge home Bleeding precautions discussed Patient advised to follow-up in the office within the next 1-2 weeks Patient may return to MAU as needed or if her condition were to change or worsen   Farris Has, PA-C  10/03/2013, 3:25 PM

## 2014-02-20 ENCOUNTER — Encounter (HOSPITAL_COMMUNITY): Payer: Self-pay | Admitting: *Deleted

## 2014-03-08 ENCOUNTER — Inpatient Hospital Stay (HOSPITAL_COMMUNITY): Payer: Managed Care, Other (non HMO)

## 2014-03-08 ENCOUNTER — Encounter (HOSPITAL_COMMUNITY): Payer: Self-pay

## 2014-03-08 ENCOUNTER — Encounter (HOSPITAL_COMMUNITY)
Admission: AD | Disposition: A | Discharge status: Home / Self Care | Payer: Self-pay | Source: Ambulatory Visit | Attending: Obstetrics and Gynecology

## 2014-03-08 ENCOUNTER — Inpatient Hospital Stay (HOSPITAL_COMMUNITY)
Admission: AD | Admit: 2014-03-08 | Discharge: 2014-03-11 | DRG: 765 | Disposition: A | Discharge status: Home / Self Care | Payer: Managed Care, Other (non HMO) | Source: Ambulatory Visit | Attending: Obstetrics and Gynecology | Admitting: Obstetrics and Gynecology

## 2014-03-08 ENCOUNTER — Inpatient Hospital Stay (HOSPITAL_COMMUNITY): Payer: Managed Care, Other (non HMO) | Admitting: Anesthesiology

## 2014-03-08 DIAGNOSIS — O9081 Anemia of the puerperium: Secondary | ICD-10-CM | POA: Diagnosis not present

## 2014-03-08 DIAGNOSIS — Z3A31 31 weeks gestation of pregnancy: Secondary | ICD-10-CM | POA: Diagnosis present

## 2014-03-08 DIAGNOSIS — O321XX1 Maternal care for breech presentation, fetus 1: Secondary | ICD-10-CM | POA: Diagnosis present

## 2014-03-08 DIAGNOSIS — Z9889 Other specified postprocedural states: Secondary | ICD-10-CM

## 2014-03-08 DIAGNOSIS — O42919 Preterm premature rupture of membranes, unspecified as to length of time between rupture and onset of labor, unspecified trimester: Secondary | ICD-10-CM | POA: Diagnosis present

## 2014-03-08 DIAGNOSIS — D62 Acute posthemorrhagic anemia: Secondary | ICD-10-CM | POA: Diagnosis not present

## 2014-03-08 DIAGNOSIS — O42913 Preterm premature rupture of membranes, unspecified as to length of time between rupture and onset of labor, third trimester: Secondary | ICD-10-CM | POA: Diagnosis present

## 2014-03-08 DIAGNOSIS — O321XX0 Maternal care for breech presentation, not applicable or unspecified: Secondary | ICD-10-CM | POA: Diagnosis not present

## 2014-03-08 DIAGNOSIS — O30043 Twin pregnancy, dichorionic/diamniotic, third trimester: Secondary | ICD-10-CM | POA: Diagnosis present

## 2014-03-08 LAB — URINALYSIS, ROUTINE W REFLEX MICROSCOPIC
Bilirubin Urine: NEGATIVE
GLUCOSE, UA: NEGATIVE mg/dL
Ketones, ur: NEGATIVE mg/dL
Leukocytes, UA: NEGATIVE
Nitrite: NEGATIVE
PH: 5.5 (ref 5.0–8.0)
PROTEIN: NEGATIVE mg/dL
Specific Gravity, Urine: 1.02 (ref 1.005–1.030)
Urobilinogen, UA: 0.2 mg/dL (ref 0.0–1.0)

## 2014-03-08 LAB — URINE MICROSCOPIC-ADD ON

## 2014-03-08 LAB — CBC
HEMATOCRIT: 37.2 % (ref 36.0–46.0)
HEMOGLOBIN: 12.6 g/dL (ref 12.0–15.0)
MCH: 28.1 pg (ref 26.0–34.0)
MCHC: 33.9 g/dL (ref 30.0–36.0)
MCV: 83 fL (ref 78.0–100.0)
Platelets: 204 10*3/uL (ref 150–400)
RBC: 4.48 MIL/uL (ref 3.87–5.11)
RDW: 14.1 % (ref 11.5–15.5)
WBC: 14.3 10*3/uL — ABNORMAL HIGH (ref 4.0–10.5)

## 2014-03-08 SURGERY — Surgical Case
Anesthesia: General | Site: Abdomen

## 2014-03-08 MED ORDER — NEOSTIGMINE METHYLSULFATE 10 MG/10ML IV SOLN
INTRAVENOUS | Status: AC
  Filled 2014-03-08: qty 1

## 2014-03-08 MED ORDER — CEFAZOLIN SODIUM-DEXTROSE 2-3 GM-% IV SOLR
INTRAVENOUS | Status: DC | PRN
  Administered 2014-03-08: 2 g via INTRAVENOUS

## 2014-03-08 MED ORDER — ONDANSETRON HCL 4 MG/2ML IJ SOLN
INTRAMUSCULAR | Status: AC
  Filled 2014-03-08: qty 2

## 2014-03-08 MED ORDER — METHYLERGONOVINE MALEATE 0.2 MG/ML IJ SOLN
INTRAMUSCULAR | Status: DC | PRN
  Administered 2014-03-08: 0.2 mg via INTRAMUSCULAR

## 2014-03-08 MED ORDER — ONDANSETRON HCL 4 MG PO TABS
4.0000 mg | ORAL_TABLET | ORAL | Status: DC | PRN
  Administered 2014-03-10 (×2): 4 mg via ORAL
  Filled 2014-03-08 (×2): qty 1

## 2014-03-08 MED ORDER — SUCCINYLCHOLINE CHLORIDE 20 MG/ML IJ SOLN
INTRAMUSCULAR | Status: AC
  Filled 2014-03-08: qty 10

## 2014-03-08 MED ORDER — METHYLERGONOVINE MALEATE 0.2 MG PO TABS: 0.2000 mg | ORAL_TABLET | ORAL | Status: DC | PRN

## 2014-03-08 MED ORDER — OXYTOCIN 10 UNIT/ML IJ SOLN
INTRAMUSCULAR | Status: AC
  Filled 2014-03-08: qty 4

## 2014-03-08 MED ORDER — IBUPROFEN 600 MG PO TABS
600.0000 mg | ORAL_TABLET | Freq: Four times a day (QID) | ORAL | Status: DC
  Administered 2014-03-08 – 2014-03-11 (×13): 600 mg via ORAL
  Filled 2014-03-08 (×12): qty 1

## 2014-03-08 MED ORDER — FENTANYL CITRATE 0.05 MG/ML IJ SOLN
INTRAMUSCULAR | Status: AC
  Filled 2014-03-08: qty 5

## 2014-03-08 MED ORDER — WITCH HAZEL-GLYCERIN EX PADS: 1.0000 "application " | MEDICATED_PAD | CUTANEOUS | Status: DC | PRN

## 2014-03-08 MED ORDER — SIMETHICONE 80 MG PO CHEW: 80.0000 mg | CHEWABLE_TABLET | ORAL | Status: DC | PRN

## 2014-03-08 MED ORDER — SCOPOLAMINE 1 MG/3DAYS TD PT72
MEDICATED_PATCH | TRANSDERMAL | Status: DC | PRN
  Administered 2014-03-08: 1 via TRANSDERMAL

## 2014-03-08 MED ORDER — LIDOCAINE HCL (CARDIAC) 20 MG/ML IV SOLN
INTRAVENOUS | Status: AC
  Filled 2014-03-08: qty 5

## 2014-03-08 MED ORDER — INFLUENZA VAC SPLIT QUAD 0.5 ML IM SUSY: 0.5000 mL | PREFILLED_SYRINGE | INTRAMUSCULAR | Status: DC

## 2014-03-08 MED ORDER — SIMETHICONE 80 MG PO CHEW
80.0000 mg | CHEWABLE_TABLET | ORAL | Status: DC
  Administered 2014-03-08 – 2014-03-10 (×3): 80 mg via ORAL
  Filled 2014-03-08 (×3): qty 1

## 2014-03-08 MED ORDER — MIDAZOLAM HCL 2 MG/2ML IJ SOLN
INTRAMUSCULAR | Status: AC
  Filled 2014-03-08: qty 2

## 2014-03-08 MED ORDER — ZOLPIDEM TARTRATE 5 MG PO TABS: 5.0000 mg | ORAL_TABLET | Freq: Every evening | ORAL | Status: DC | PRN

## 2014-03-08 MED ORDER — BISACODYL 10 MG RE SUPP
10.0000 mg | Freq: Every day | RECTAL | Status: DC | PRN
  Administered 2014-03-11: 10 mg via RECTAL
  Filled 2014-03-08: qty 1

## 2014-03-08 MED ORDER — OXYCODONE-ACETAMINOPHEN 5-325 MG PO TABS
1.0000 | ORAL_TABLET | ORAL | Status: DC | PRN
  Administered 2014-03-08 – 2014-03-11 (×6): 1 via ORAL
  Filled 2014-03-08 (×7): qty 1

## 2014-03-08 MED ORDER — LACTATED RINGERS IV SOLN
INTRAVENOUS | Status: DC | PRN
  Administered 2014-03-08 (×2): via INTRAVENOUS

## 2014-03-08 MED ORDER — SIMETHICONE 80 MG PO CHEW
80.0000 mg | CHEWABLE_TABLET | Freq: Three times a day (TID) | ORAL | Status: DC
  Administered 2014-03-09 – 2014-03-11 (×6): 80 mg via ORAL
  Filled 2014-03-08 (×8): qty 1

## 2014-03-08 MED ORDER — OXYCODONE-ACETAMINOPHEN 5-325 MG PO TABS: 2.0000 | ORAL_TABLET | ORAL | Status: DC | PRN

## 2014-03-08 MED ORDER — HYDROMORPHONE HCL 1 MG/ML IJ SOLN
0.2500 mg | INTRAMUSCULAR | Status: DC | PRN
  Administered 2014-03-08: 0.25 mg via INTRAVENOUS
  Administered 2014-03-08: 0.5 mg via INTRAVENOUS
  Administered 2014-03-08: 0.25 mg via INTRAVENOUS

## 2014-03-08 MED ORDER — FENTANYL CITRATE 0.05 MG/ML IJ SOLN
INTRAMUSCULAR | Status: DC | PRN
  Administered 2014-03-08: 50 ug via INTRAVENOUS
  Administered 2014-03-08 (×3): 100 ug via INTRAVENOUS

## 2014-03-08 MED ORDER — HYDROMORPHONE HCL 1 MG/ML IJ SOLN
INTRAMUSCULAR | Status: AC
  Filled 2014-03-08: qty 1

## 2014-03-08 MED ORDER — NALOXONE HCL 0.4 MG/ML IJ SOLN: 0.4000 mg | INTRAMUSCULAR | Status: DC | PRN

## 2014-03-08 MED ORDER — METHYLERGONOVINE MALEATE 0.2 MG/ML IJ SOLN: 0.2000 mg | INTRAMUSCULAR | Status: DC | PRN

## 2014-03-08 MED ORDER — DIPHENHYDRAMINE HCL 12.5 MG/5ML PO ELIX: 12.5000 mg | ORAL_SOLUTION | Freq: Four times a day (QID) | ORAL | Status: DC | PRN

## 2014-03-08 MED ORDER — HYDROMORPHONE 0.3 MG/ML IV SOLN
INTRAVENOUS | Status: DC
  Administered 2014-03-08: 11:00:00 via INTRAVENOUS
  Administered 2014-03-08: 0.4 mg via INTRAVENOUS
  Administered 2014-03-08: 0.2 mg via INTRAVENOUS
  Filled 2014-03-08: qty 25

## 2014-03-08 MED ORDER — SODIUM CHLORIDE 0.9 % IV SOLN: 250.0000 mL | INTRAVENOUS | Status: DC

## 2014-03-08 MED ORDER — CEFAZOLIN SODIUM-DEXTROSE 2-3 GM-% IV SOLR
INTRAVENOUS | Status: AC
  Filled 2014-03-08: qty 50

## 2014-03-08 MED ORDER — GLYCOPYRROLATE 0.2 MG/ML IJ SOLN
INTRAMUSCULAR | Status: AC
  Filled 2014-03-08: qty 3

## 2014-03-08 MED ORDER — SENNOSIDES-DOCUSATE SODIUM 8.6-50 MG PO TABS
2.0000 | ORAL_TABLET | ORAL | Status: DC
  Administered 2014-03-08 – 2014-03-10 (×3): 2 via ORAL
  Filled 2014-03-08 (×3): qty 2

## 2014-03-08 MED ORDER — FERROUS SULFATE 325 (65 FE) MG PO TABS
325.0000 mg | ORAL_TABLET | Freq: Two times a day (BID) | ORAL | Status: DC
  Administered 2014-03-09 – 2014-03-11 (×5): 325 mg via ORAL
  Filled 2014-03-08 (×5): qty 1

## 2014-03-08 MED ORDER — METHYLERGONOVINE MALEATE 0.2 MG/ML IJ SOLN
INTRAMUSCULAR | Status: AC
  Filled 2014-03-08: qty 1

## 2014-03-08 MED ORDER — ONDANSETRON HCL 4 MG/2ML IJ SOLN
INTRAMUSCULAR | Status: DC | PRN
  Administered 2014-03-08: 4 mg via INTRAVENOUS

## 2014-03-08 MED ORDER — SODIUM CHLORIDE 0.9 % IJ SOLN
3.0000 mL | Freq: Two times a day (BID) | INTRAMUSCULAR | Status: DC
  Administered 2014-03-09: 3 mL via INTRAVENOUS

## 2014-03-08 MED ORDER — PHENYLEPHRINE HCL 10 MG/ML IJ SOLN
INTRAMUSCULAR | Status: DC | PRN
  Administered 2014-03-08: 80 ug via INTRAVENOUS

## 2014-03-08 MED ORDER — CEFAZOLIN SODIUM 1-5 GM-% IV SOLN
1.0000 g | Freq: Three times a day (TID) | INTRAVENOUS | Status: AC
  Administered 2014-03-08: 1 g via INTRAVENOUS
  Filled 2014-03-08: qty 50

## 2014-03-08 MED ORDER — PROPOFOL 10 MG/ML IV BOLUS: INTRAVENOUS | Status: DC | PRN

## 2014-03-08 MED ORDER — SODIUM CHLORIDE 0.9 % IJ SOLN: 3.0000 mL | INTRAMUSCULAR | Status: DC | PRN

## 2014-03-08 MED ORDER — TETANUS-DIPHTH-ACELL PERTUSSIS 5-2.5-18.5 LF-MCG/0.5 IM SUSP
0.5000 mL | Freq: Once | INTRAMUSCULAR | Status: AC
  Administered 2014-03-11: 0.5 mL via INTRAMUSCULAR
  Filled 2014-03-08: qty 0.5

## 2014-03-08 MED ORDER — OXYTOCIN 40 UNITS IN LACTATED RINGERS INFUSION - SIMPLE MED: 62.5000 mL/h | INTRAVENOUS | Status: AC

## 2014-03-08 MED ORDER — MENTHOL 3 MG MT LOZG: 1.0000 | LOZENGE | OROMUCOSAL | Status: DC | PRN

## 2014-03-08 MED ORDER — MORPHINE SULFATE 0.5 MG/ML IJ SOLN
INTRAMUSCULAR | Status: AC
  Filled 2014-03-08: qty 10

## 2014-03-08 MED ORDER — HYDROMORPHONE HCL 1 MG/ML IJ SOLN
INTRAMUSCULAR | Status: AC
  Administered 2014-03-08: 0.25 mg via INTRAVENOUS
  Filled 2014-03-08: qty 1

## 2014-03-08 MED ORDER — SODIUM CHLORIDE 0.9 % IJ SOLN: 9.0000 mL | INTRAMUSCULAR | Status: DC | PRN

## 2014-03-08 MED ORDER — DIPHENHYDRAMINE HCL 25 MG PO CAPS
25.0000 mg | ORAL_CAPSULE | Freq: Four times a day (QID) | ORAL | Status: DC | PRN
  Administered 2014-03-10: 25 mg via ORAL
  Filled 2014-03-08: qty 1

## 2014-03-08 MED ORDER — DIPHENHYDRAMINE HCL 50 MG/ML IJ SOLN: 12.5000 mg | Freq: Four times a day (QID) | INTRAMUSCULAR | Status: DC | PRN

## 2014-03-08 MED ORDER — PRENATAL MULTIVITAMIN CH
1.0000 | ORAL_TABLET | Freq: Every day | ORAL | Status: DC
  Administered 2014-03-09 – 2014-03-11 (×3): 1 via ORAL
  Filled 2014-03-08 (×3): qty 1

## 2014-03-08 MED ORDER — FLEET ENEMA 7-19 GM/118ML RE ENEM: 1.0000 | ENEMA | Freq: Every day | RECTAL | Status: DC | PRN

## 2014-03-08 MED ORDER — LANOLIN HYDROUS EX OINT: 1.0000 "application " | TOPICAL_OINTMENT | CUTANEOUS | Status: DC | PRN

## 2014-03-08 MED ORDER — HYDROMORPHONE HCL 1 MG/ML IJ SOLN
INTRAMUSCULAR | Status: DC | PRN
  Administered 2014-03-08: .5 mg via INTRAVENOUS
  Administered 2014-03-08: 0.5 mg via INTRAVENOUS

## 2014-03-08 MED ORDER — DEXTROSE IN LACTATED RINGERS 5 % IV SOLN
INTRAVENOUS | Status: DC
  Administered 2014-03-08: 12:00:00 via INTRAVENOUS

## 2014-03-08 MED ORDER — SUCCINYLCHOLINE CHLORIDE 20 MG/ML IJ SOLN
INTRAMUSCULAR | Status: DC | PRN
  Administered 2014-03-08: 100 mg via INTRAVENOUS

## 2014-03-08 MED ORDER — ONDANSETRON HCL 4 MG/2ML IJ SOLN: 4.0000 mg | INTRAMUSCULAR | Status: DC | PRN

## 2014-03-08 MED ORDER — FENTANYL CITRATE 0.05 MG/ML IJ SOLN
INTRAMUSCULAR | Status: AC
  Filled 2014-03-08: qty 2

## 2014-03-08 MED ORDER — PROPOFOL 10 MG/ML IV BOLUS
INTRAVENOUS | Status: DC | PRN
  Administered 2014-03-08: 150 mg via INTRAVENOUS

## 2014-03-08 MED ORDER — LIDOCAINE HCL (CARDIAC) 20 MG/ML IV SOLN
INTRAVENOUS | Status: DC | PRN
  Administered 2014-03-08: 30 mg via INTRAVENOUS

## 2014-03-08 MED ORDER — MIDAZOLAM HCL 2 MG/2ML IJ SOLN
INTRAMUSCULAR | Status: DC | PRN
  Administered 2014-03-08: 2 mg via INTRAVENOUS

## 2014-03-08 MED ORDER — SCOPOLAMINE 1 MG/3DAYS TD PT72
MEDICATED_PATCH | TRANSDERMAL | Status: AC
  Filled 2014-03-08: qty 1

## 2014-03-08 MED ORDER — DIBUCAINE 1 % RE OINT: 1.0000 "application " | TOPICAL_OINTMENT | RECTAL | Status: DC | PRN

## 2014-03-08 MED ORDER — ONDANSETRON HCL 4 MG/2ML IJ SOLN: 4.0000 mg | Freq: Four times a day (QID) | INTRAMUSCULAR | Status: DC | PRN

## 2014-03-08 MED ORDER — PHENYLEPHRINE HCL 10 MG/ML IJ SOLN: INTRAMUSCULAR | Status: DC | PRN

## 2014-03-08 MED ORDER — PROPOFOL 10 MG/ML IV EMUL
INTRAVENOUS | Status: AC
  Filled 2014-03-08: qty 20

## 2014-03-08 SURGICAL SUPPLY — 40 items
BARRIER ADHS 3X4 INTERCEED (GAUZE/BANDAGES/DRESSINGS) ×2 IMPLANT
BENZOIN TINCTURE PRP APPL 2/3 (GAUZE/BANDAGES/DRESSINGS) IMPLANT
CLAMP CORD UMBIL (MISCELLANEOUS) IMPLANT
CLOTH BEACON ORANGE TIMEOUT ST (SAFETY) ×2 IMPLANT
CONTAINER PREFILL 10% NBF 15ML (MISCELLANEOUS) IMPLANT
DRAPE SHEET LG 3/4 BI-LAMINATE (DRAPES) IMPLANT
DRSG OPSITE POSTOP 4X10 (GAUZE/BANDAGES/DRESSINGS) ×2 IMPLANT
DURAPREP 26ML APPLICATOR (WOUND CARE) ×2 IMPLANT
ELECT REM PT RETURN 9FT ADLT (ELECTROSURGICAL) ×2
ELECTRODE REM PT RTRN 9FT ADLT (ELECTROSURGICAL) ×1 IMPLANT
EXTRACTOR VACUUM M CUP 4 TUBE (SUCTIONS) IMPLANT
GLOVE BIOGEL PI IND STRL 7.0 (GLOVE) ×1 IMPLANT
GLOVE BIOGEL PI INDICATOR 7.0 (GLOVE) ×1
GLOVE ECLIPSE 6.5 STRL STRAW (GLOVE) ×2 IMPLANT
GOWN STRL REUS W/TWL LRG LVL3 (GOWN DISPOSABLE) ×4 IMPLANT
KIT ABG SYR 3ML LUER SLIP (SYRINGE) ×4 IMPLANT
NEEDLE HYPO 25X1 1.5 SAFETY (NEEDLE) IMPLANT
NEEDLE HYPO 25X5/8 SAFETYGLIDE (NEEDLE) ×4 IMPLANT
NS IRRIG 1000ML POUR BTL (IV SOLUTION) ×2 IMPLANT
PACK C SECTION WH (CUSTOM PROCEDURE TRAY) ×2 IMPLANT
PAD OB MATERNITY 4.3X12.25 (PERSONAL CARE ITEMS) ×2 IMPLANT
RTRCTR C-SECT PINK 25CM LRG (MISCELLANEOUS) IMPLANT
SPONGE LAP 18X18 X RAY DECT (DISPOSABLE) ×6 IMPLANT
STAPLER VISISTAT 35W (STAPLE) ×2 IMPLANT
STRIP CLOSURE SKIN 1/2X4 (GAUZE/BANDAGES/DRESSINGS) IMPLANT
SUT CHROMIC GUT AB #0 18 (SUTURE) IMPLANT
SUT MNCRL 0 VIOLET CTX 36 (SUTURE) ×4 IMPLANT
SUT MON AB 4-0 PS1 27 (SUTURE) IMPLANT
SUT MONOCRYL 0 CTX 36 (SUTURE) ×4
SUT PLAIN 2 0 (SUTURE)
SUT PLAIN 2 0 XLH (SUTURE) ×2 IMPLANT
SUT PLAIN ABS 2-0 CT1 27XMFL (SUTURE) IMPLANT
SUT VIC AB 0 CT1 36 (SUTURE) ×6 IMPLANT
SUT VIC AB 2-0 CT1 27 (SUTURE) ×2
SUT VIC AB 2-0 CT1 TAPERPNT 27 (SUTURE) ×2 IMPLANT
SUT VIC AB 4-0 PS2 27 (SUTURE) IMPLANT
SYR CONTROL 10ML LL (SYRINGE) ×2 IMPLANT
TOWEL OR 17X24 6PK STRL BLUE (TOWEL DISPOSABLE) ×2 IMPLANT
TRAY FOLEY CATH 14FR (SET/KITS/TRAYS/PACK) ×2 IMPLANT
WATER STERILE IRR 1000ML POUR (IV SOLUTION) IMPLANT

## 2014-03-08 NOTE — Transfer of Care (Signed)
Immediate Anesthesia Transfer of Care Note  Patient: Rose Bennett  Procedure(s) Performed: Procedure(s): CESAREAN SECTION (N/A)  Patient Location: PACU  Anesthesia Type:General  Level of Consciousness: awake  Airway & Oxygen Therapy: Patient Spontanous Breathing  Post-op Assessment: Report given to PACU RN  Post vital signs: stable  Filed Vitals:   03/08/14 0522  BP: 136/82  Pulse: 76  Temp: 37.1 C  Resp: 20    Complications: No apparent anesthesia complications

## 2014-03-08 NOTE — MAU Note (Signed)
Water broke at 0450.  Clear fluid.  Twins- babies were not moving as well after water broke but now moving.  No bleeding.

## 2014-03-08 NOTE — Anesthesia Postprocedure Evaluation (Signed)
  Anesthesia Post-op Note  Patient: Rose Bennett  Procedure(s) Performed: Procedure(s): CESAREAN SECTION (N/A)  Patient Location: PACU and Women's Unit  Anesthesia Type:General  Level of Consciousness: awake, alert  and oriented  Airway and Oxygen Therapy: Patient Spontanous Breathing and Patient connected to nasal cannula oxygen  Post-op Pain: mild  Post-op Assessment: Post-op Vital signs reviewed, Patient's Cardiovascular Status Stable, Respiratory Function Stable, No signs of Nausea or vomiting, Adequate PO intake and Pain level controlled  Post-op Vital Signs: Reviewed and stable  Last Vitals:  Filed Vitals:   03/08/14 1100  BP: 121/71  Pulse: 68  Temp: 37.2 C  Resp: 16    Complications: No apparent anesthesia complications

## 2014-03-08 NOTE — Plan of Care (Signed)
Problem: Phase II Progression Outcomes Goal: Pain controlled on oral analgesia Outcome: Completed/Met Date Met:  03/08/14 Goal: Rh isoimmunization per orders Outcome: Completed/Met Date Met:  03/08/14 Goal: Tolerating diet Outcome: Completed/Met Date Met:  03/08/14

## 2014-03-08 NOTE — Progress Notes (Addendum)
Pumping initiated with patient.  Explanation of breast pump and frequency discussed. Information provided will continue to monitor.  Leighton Roach, RN------------------

## 2014-03-08 NOTE — Anesthesia Postprocedure Evaluation (Signed)
Anesthesia Post Note  Patient: Rose Bennett  Procedure(s) Performed: Procedure(s) (LRB): CESAREAN SECTION (N/A)  Anesthesia type: GA  Patient location: PACU  Post pain: Pain level controlled  Post assessment: Post-op Vital signs reviewed  Last Vitals:  Filed Vitals:   03/08/14 0855  BP:   Pulse: 68  Temp:   Resp: 16    Post vital signs: Reviewed  Level of consciousness: awake  Complications: No apparent anesthesia complications

## 2014-03-08 NOTE — Anesthesia Preprocedure Evaluation (Addendum)
Anesthesia Evaluation  Patient identified by MRN, date of birth, ID band Patient awake    Reviewed: Allergy & Precautions, H&P , Patient's Chart, lab work & pertinent test results, reviewed documented beta blocker date and time Preop documentation limited or incomplete due to emergent nature of procedure.  Airway Mallampati: II  TM Distance: >3 FB Neck ROM: full    Dental no notable dental hx.    Pulmonary  breath sounds clear to auscultation  Pulmonary exam normal       Cardiovascular Rhythm:regular Rate:Normal     Neuro/Psych    GI/Hepatic   Endo/Other    Renal/GU      Musculoskeletal   Abdominal   Peds  Hematology   Anesthesia Other Findings   Reproductive/Obstetrics                             Anesthesia Physical Anesthesia Plan  ASA: II and emergent  Anesthesia Plan: General   Post-op Pain Management:    Induction: Intravenous  Airway Management Planned: Oral ETT  Additional Equipment:   Intra-op Plan:   Post-operative Plan: Extubation in OR  Informed Consent: I have reviewed the patients History and Physical, chart, labs and discussed the procedure including the risks, benefits and alternatives for the proposed anesthesia with the patient or authorized representative who has indicated his/her understanding and acceptance.   Dental Advisory Given and Dental advisory given  Plan Discussed with: CRNA and Surgeon  Anesthesia Plan Comments: (Pt arrived inncorrectly to OR 1; I pushed her to Rm 9. Pt Id'd  Discussed STAT general anesthesia, including possible nausea, instrumentation of airway, sore throat,pulmonary aspiration, etc. I asked if the were any outstanding questions, or  concerns before we proceeded. )       Anesthesia Quick Evaluation

## 2014-03-08 NOTE — Brief Op Note (Signed)
03/08/2014  7:45 AM  PATIENT:  Rose Bennett  33 y.o. female  PRE-OPERATIVE DIAGNOSIS:  PPROM, Preterm Labor,  Rose Bennett( twin A) Rose Bennett(twin B), Twin gestation 31 5/7 weeks  POST-OPERATIVE DIAGNOSIS:  PPROM,  Preterm Labor, Rose Bennett( twinA/transverse Bennett, twin gestation 31 5/7 weeks  PROCEDURE:  Stat Primary Cesarean section, Rose Bennett hysterotomy  SURGEON:  Surgeon(s) and Role:    * Jahmai Finelli Clint Bolder, MD - Primary  PHYSICIAN ASSISTANT:   ASSISTANTS: Laury Deep, CNM   ANESTHESIA:   general Findings: frank Bennett (twinA), live femalewe Apgar 8/5/8,  Transverse Bennett( twin B) converted to vtx  For delivery live female  Apgar 8/5/8, twin placenta Ovaries not seen EBL:  Total I/O In: 300 [I.V.:300] Out: 300 [Blood:300]  BLOOD ADMINISTERED:none  DRAINS: none   LOCAL MEDICATIONS USED:  MARCAINE     SPECIMEN:  Source of Specimen:  Placenta x 2   DISPOSITION OF SPECIMEN:  PATHOLOGY  COUNTS:  YES  TOURNIQUET:  * No tourniquets in log *  DICTATION: .Other Dictation: Dictation Number [id number]   PLAN OF CARE: Admit to inpatient   PATIENT DISPOSITION:  PACU - hemodynamically stable.   Delay start of Pharmacological VTE agent (>24hrs) due to surgical blood loss or risk of bleeding: no

## 2014-03-08 NOTE — H&P (Signed)
Rose Bennett is a 33 y.o. female presenting with PPROM( twins) 31 5/7 weeks @ 4:45 am clear fluid . Pt presented to MAU with grossly SROM. Known twin breech/transverse lie confirmed by sono at bedside. VE 5/100/ breech presentation( frank). Uncomplicated IVF twin ( didi ) pregnancy to date. Hx stage IV pelvic endometriosis. Pt c/o urge to push.. Presentating part@ 0 station Maternal Medical History:  Reason for admission: Rupture of membranes.   Contractions: Onset was less than 1 hour ago.    Fetal activity: Perceived fetal activity is normal.    Prenatal complications: no prenatal complications Prenatal Complications - Diabetes: none.    OB History    Gravida Para Term Preterm AB TAB SAB Ectopic Multiple Living   1 1  1     1 1      Past Medical History  Diagnosis Date  . Tachycardia   . Dermatitis   . Lipoma   . Endometriosis   . POTS (postural orthostatic tachycardia syndrome)   . Hx of echocardiogram     a. echo 12/13:  mild LVH, EF 55%, normal diast fn   Past Surgical History  Procedure Laterality Date  . Laparotomy      x5  . Appendectomy    . Polypectomy      ovarian  . Laporoscopy      seven surgeries   Family History: family history includes Heart disease in her mother and sister. Social History:  reports that she has never smoked. She does not have any smokeless tobacco history on file. She reports that she drinks alcohol. She reports that she does not use illicit drugs.   Prenatal Transfer Tool  Maternal Diabetes: No Genetic Screening: Normal Maternal Ultrasounds/Referrals: Normal Fetal Ultrasounds or other Referrals:  None Maternal Substance Abuse:  No Significant Maternal Medications:  None Significant Maternal Lab Results:  Lab values include: Rh negative, Other:  GBS unknown Other Comments:  twin gestation( didi)  Review of Systems  All other systems reviewed and are negative.   Dilation: 5 Effacement (%): 90 Station: -2 Exam by:: Benji  StanleyRN Blood pressure 133/85, pulse 66, temperature 98 F (36.7 C), temperature source Oral, resp. rate 12, SpO2 100 %, unknown if currently breastfeeding. Exam Physical Exam  Constitutional: She is oriented to person, place, and time. She appears well-developed and well-nourished.  HENT:  Head: Atraumatic.  Eyes: EOM are normal.  Neck: Neck supple.  Cardiovascular: Regular rhythm.   Respiratory: Effort normal.  GI: Soft.  Neurological: She is alert and oriented to person, place, and time.  Skin: Skin is warm and dry.    Prenatal labs: ABO, Rh: --/--/A NEG, A NEG (06/08 1916) Antibody: NEG (06/08 1916) Rubella:  Immune RPR:   NR HBsAg:  neg  HIV:   NR GBS:   unknown  Assessment/Plan: PPROM Active labor Frank breech presentation  Twin gestation @ 31 5/7 weeks Rh negative P) stat C/S. Husband and pt informed. Verbal consent. To OR  Parthiv Mucci A 03/08/2014, 7:43 AM

## 2014-03-09 ENCOUNTER — Encounter (HOSPITAL_COMMUNITY): Payer: Self-pay | Admitting: Obstetrics and Gynecology

## 2014-03-09 LAB — CBC
HCT: 26.5 % — ABNORMAL LOW (ref 36.0–46.0)
Hemoglobin: 8.9 g/dL — ABNORMAL LOW (ref 12.0–15.0)
MCH: 28.6 pg (ref 26.0–34.0)
MCHC: 33.6 g/dL (ref 30.0–36.0)
MCV: 85.2 fL (ref 78.0–100.0)
PLATELETS: 156 10*3/uL (ref 150–400)
RBC: 3.11 MIL/uL — ABNORMAL LOW (ref 3.87–5.11)
RDW: 14.3 % (ref 11.5–15.5)
WBC: 18.9 10*3/uL — ABNORMAL HIGH (ref 4.0–10.5)

## 2014-03-09 MED ORDER — RHO D IMMUNE GLOBULIN 1500 UNIT/2ML IJ SOSY
300.0000 ug | PREFILLED_SYRINGE | Freq: Once | INTRAMUSCULAR | Status: AC
  Administered 2014-03-09: 300 ug via INTRAMUSCULAR
  Filled 2014-03-09: qty 2

## 2014-03-09 NOTE — Plan of Care (Signed)
Problem: Phase II Progression Outcomes Goal: Progress activity as tolerated unless otherwise ordered Outcome: Completed/Met Date Met:  03/09/14 Goal: Afebrile, VS remain stable Outcome: Completed/Met Date Met:  03/09/14 Goal: Incision intact & without signs/symptoms of infection Outcome: Completed/Met Date Met:  03/09/14

## 2014-03-09 NOTE — Lactation Note (Signed)
This note was copied from the chart of [employer]. Lactation Consultation Note Initial consultation; babies are 20 hours old; mom in her room. Mom states she has tried to pump every 3 hours and is not getting anything yet. Mom confirms she does know how to hand express, and has seen some drops.  Reassured mom that it is normal at this time to not have copious milk supply, the important thing is to continue pumping consistently.  Enc mom to pump 8 times a day, once at night, hand express after pumping, and to call for help if needed. Mom has NICU book to review.   Patient Name: Rose Bennett Date: 03/09/2014 Reason for consult: Initial assessment   Maternal Data Has patient been taught Hand Expression?: Yes Does the patient have breastfeeding experience prior to this delivery?: No  Feeding Feeding Type: Formula Length of feed: 20 min  LATCH Score/Interventions                      Lactation Tools Discussed/Used     Consult Status Consult Status: Follow-up    [place] [facility] 03/09/2014, 2:34 PM

## 2014-03-09 NOTE — Op Note (Signed)
Rose Bennett, Rose Bennett            ACCOUNT NO.:  [id number]  MEDICAL RECORD NO.:  [id number]  LOCATION:  9320                          FACILITY:  [facility]  PHYSICIAN:  Servando Salina, M.D.DATE OF BIRTH:  06-10-1980  DATE OF PROCEDURE:  03/08/2014 DATE OF DISCHARGE:  03/08/2014                              OPERATIVE REPORT   PREOPERATIVE DIAGNOSES:  Preterm premature rupture of membranes, preterm labor, frank breech presentation twin A, transverse lie twin B, intrauterine gestation at 31-5/7th weeks.  POSTOPERATIVE DIAGNOSES:  Preterm premature rupture of membranes, active preterm labor, frank breech presentation twin A, transverse presentation twin B, intrauterine gestation at 31-5/7th weeks.  PROCEDURE:  Emergency primary low-transverse cesarean section.  ANESTHESIA:  General.  SURGEON:  Servando Salina, M.D.  ASSISTANT:  Laury Deep, CNM.  DESCRIPTION OF PROCEDURE:  The patient was transferred quickly to the operating room due to the finding of 5-6 cm /100%/ with frank breech presentation halfway in the vagina.  The patient was quickly prepped and draped.  Indwelling Foley catheter was sterilely placed. Induction of general anesthesia was then done.  At that point, a Pfannenstiel skin incision was then made, carried down to the rectus fascia.  Rectus fascia was opened transversely.  The rectus fascia was then sharply dissected off the rectus muscle superiorly and extended. The vesicouterine peritoneum was opened bluntly and its incision was extended superiorly and inferiorly.  Well-developed lower uterine segment was encountered, vesicouterine peritoneum was opened transversely.  The bladder was then quickly displaced inferiorly with a bladder retractor.  Clear amniotic fluid was noted.  No odor was noted. The incision was extended transversely and subsequent delivery of twin A(live female), breech position was accomplished with the use of breech maneuver. Cord was  clamped and cut, baby was transferred to the awaiting pediatrician. Apgars 8/5/8 was assigned at 04/25/08 minutes respectively. Second twin was in the transverse position with the maternal right.  While the bag was intact, the head was gently directed towards the midline and into the pelvis.  Artificial rupture of membrane was then done, clear amniotic fluid noted and subsequent delivery of another live female.  Baby was  crying.  The cord was also clamped and cut.  The baby was transferred to the awaiting pediatrician with Apgars 8, 5 and 8 at 1 minutes, 5 minutes and 10 minutes respectively.  The placenta times two was manually removed.  Uterine cavity was cleaned of debris.  There was noted to be small areas suggesting placental tissue in the anterior fundus, which was removed using ring forceps as well as cutting protruding tissue present with scissors.  Once the cavity was cleaned of debris to my satisfaction, the uterus was then closed in two layers, the first layer with 0 Monocryl in running locked stitch, second layer was imbricated using 0 Monocryl suture after bleeding in the left anterior-inferior angle resulted in some carefully placed figure-of-eight sutures until hemostasis achieved.  The uterine cavity incision was closed.  The Interceed was placed over the lower uterine segment.  The parietal peritoneum was then closed with 2-0 Vicryl, the rectus fascia was approximated with 0 Vicryl x2.  The subcutaneous area was irrigated with small bleeders cauterized. Interrupted 2-0 plain  suture was placed and the skin approximated with Ethicon staples.  SPECIMENS:  Placenta x2, sent to Pathology.  ESTIMATED BLOOD LOSS:  900 mL.  INTRAOPERATIVE FLUID:  Two liters.  URINE OUTPUT:  75 mL of concentrated urine with some light blood tinge noted.  The color of the urine was clear throughout until few minutes prior to closing the skin incision where the small tinge was noted.  COUNTS:   Sponge and instrument counts x2 were correct.  COMPLICATION:  None.  The patient tolerated the procedure well, was transferred to recovery room.  The babies, due to their prematurity x2, was transferred to the Neonatal Intensive Care Unit.     Servando Salina, M.D.     [place]/MEDQ  D:  03/08/2014  T:  03/09/2014  Job:  [id number]

## 2014-03-09 NOTE — Progress Notes (Signed)
Patient ID: Rose Bennett, female   DOB: 15-Nov-1980, 33 y.o.   MRN: [id number] Subjective: POD# 1 Information for the patient's newborn:  Rose, Bennett [[id number]]  female Information for the patient's newborn:  Rose, Bennett [[id number]]  female  Reports feeling tired Feeding: bottle - in NICU Patient reports tolerating PO.  Breast symptoms: pumping with little to no colostrum Pain controlled with ibuprofen (OTC) and narcotic analgesics including Percocet Denies HA/SOB/C/P/N/V/dizziness. Flatus present. She reports vaginal bleeding as normal, without clots.  She is ambulating, urinating without difficult.     Objective:   VS:  Filed Vitals:   03/08/14 1930 03/08/14 2259 03/09/14 0059 03/09/14 0524  BP:  135/82 109/60 130/66  Pulse:  64 67 73  Temp:  99.1 F (37.3 C) 98.9 F (37.2 C) 98.6 F (37 C)  TempSrc:  Oral Oral Oral  Resp:  18 18 16   Height: 5\' 2"  (1.575 m)     Weight: 78.019 kg (172 lb)     SpO2:  100% 98% 97%     Intake/Output Summary (Last 24 hours) at 03/09/14 1057 Last data filed at 03/09/14 0543  Gross per 24 hour  Intake 2795.05 ml  Output   1050 ml  Net 1745.05 ml        Recent Labs  03/08/14 0540 03/09/14 0518  WBC 14.3* 18.9*  HGB 12.6 8.9*  HCT 37.2 26.5*  PLT 204 156     Blood type: A NEG (11/18 1811) / Requires Rhophylac  Rubella:  Immune    Physical Exam:  General: alert, cooperative, fatigued and no distress CV: Regular rate and rhythm, S1S2 present or without murmur or extra heart sounds Resp: clear Abdomen: soft, nontender, hypoactive bowel sounds Incision: Tegaderm and Honeycomb dressing C/D/I - skin well-approximated with staples Uterine Fundus: firm, 2 FB below umbilicus, nontender Lochia: minimal Ext: edema trace and Homans sign is negative, no sign of DVT   Assessment/Plan: 33 y.o.   POD# 1.  s/p Cesarean Delivery.  Indications: Preterm premature rupture of membranes, preterm labor, frank breech  presentation (twin A), transverse presentation (twin B)                Principal Problem:   Postpartum care following cesarean delivery - twins @ 31.5wk (11/18) Active Problems:   Presentation of cord   Dichorionic diamniotic twin pregnancy in third trimester   [redacted] weeks gestation of pregnancy Rh Negative  Doing well, stable.               Regular diet as tolerated Give Rhophylac 300 mcg IM prior to discharge Ambulate Attempt pumping breast every 2-3 hours to encourage increased supply Routine post-op care  Graceann Congress, MSN, CNM 03/09/2014, 10:57 AM

## 2014-03-09 NOTE — Plan of Care (Signed)
Problem: Discharge Progression Outcomes Goal: Tolerating diet Outcome: Completed/Met Date Met:  03/09/14 Goal: Pain controlled with appropriate interventions Outcome: Completed/Met Date Met:  03/09/14 Goal: MMR given as ordered Outcome: Not Applicable Date Met:  41/42/39

## 2014-03-10 LAB — RH IG WORKUP (INCLUDES ABO/RH)
ABO/RH(D): A NEG
ANTIBODY SCREEN: POSITIVE
DAT, IGG: NEGATIVE
GESTATIONAL AGE(WKS): 31.5
Unit division: 0

## 2014-03-10 MED ORDER — MAGNESIUM OXIDE 400 (241.3 MG) MG PO TABS
200.0000 mg | ORAL_TABLET | Freq: Every day | ORAL | Status: DC
  Administered 2014-03-10: 200 mg via ORAL
  Filled 2014-03-10 (×4): qty 0.5

## 2014-03-10 NOTE — Plan of Care (Signed)
Problem: Discharge Progression Outcomes Goal: Complications resolved/controlled Outcome: Completed/Met Date Met:  03/10/14 Goal: Afebrile, VS remain stable at discharge Outcome: Completed/Met Date Met:  03/10/14

## 2014-03-10 NOTE — Progress Notes (Signed)
POD # 2  Subjective: Pt reports feeling well/ Pain controlled with Motrin and Percocet Tolerating po/Voiding without problems/ No n/v-had episode during night, none today/ Flatus present Activity: ad lib Bleeding is light Newborn info:  Information for the patient's newborn:  Verleen, Stuckey [[id number]]  female Information for the patient's newborn:  Mignon, Bechler [[id number]]  female   Feeding: breastpumping   Objective: VS:  Filed Vitals:   03/09/14 1159 03/09/14 1800 03/09/14 2142 03/10/14 0604  BP: 106/66 119/64 114/65 116/71  Pulse: 73 98 94 92  Temp: 98 F (36.7 C) 98.7 F (37.1 C) 98.7 F (37.1 C) 99.6 F (37.6 C)  TempSrc: Oral Oral Oral Oral  Resp: 16 16 18 18   Height:      Weight:      SpO2: 97% 100% 100% 99%    I&O: Intake/Output      11/19 0701 - 11/20 0700 11/20 0701 - 11/21 0700   P.O.     I.V. (mL/kg)     Total Intake(mL/kg)     Urine (mL/kg/hr)     Emesis/NG output 900 (0.5)    Blood     Total Output 900     Net -900            LABS:  Recent Labs  03/08/14 0540 03/09/14 0518  WBC 14.3* 18.9*  HGB 12.6 8.9*  HCT 37.2 26.5*  PLT 204 156    Blood type: --/--/A NEG (11/18 1811) Rubella:   IMMUNE    Physical Exam:  General: alert and cooperative CV: Regular rate and rhythm Resp: CTA bilaterally Abdomen: soft, nontender, normal bowel sounds Uterine Fundus: firm, below umbilicus, nontender Incision: Covered with Tegaderm and honeycomb dressing; no significant drainage, edema, bruising, or erythema; well approximated with staples Lochia: minimal Ext: edema trace BLE and Homans sign is negative, no sign of DVT    Assessment/: POD # 2/ G1P0101/ S/P C/Section d/t twins, 31 wks, active labor Rh negative-Rhogam given ABL anemia Doing well  Plan: Continue routine post op orders Anticipate discharge home tomorrow   Signed: Julianne Handler, Delane Ginger, MSN, CNM 03/10/2014, 1:34 PM

## 2014-03-11 DIAGNOSIS — O42919 Preterm premature rupture of membranes, unspecified as to length of time between rupture and onset of labor, unspecified trimester: Secondary | ICD-10-CM | POA: Diagnosis present

## 2014-03-11 MED ORDER — IBUPROFEN 600 MG PO TABS: 600.0000 mg | ORAL_TABLET | Freq: Four times a day (QID) | ORAL | Status: DC

## 2014-03-11 MED ORDER — OXYCODONE-ACETAMINOPHEN 5-325 MG PO TABS: 1.0000 | ORAL_TABLET | ORAL | Status: DC | PRN

## 2014-03-11 MED ORDER — HYDROCHLOROTHIAZIDE 12.5 MG PO CAPS: 12.5000 mg | ORAL_CAPSULE | Freq: Every day | ORAL | Status: DC

## 2014-03-11 NOTE — Progress Notes (Signed)
SVD: primary C/S  S:  Pt reports feeling  Well (+) flatus/ Tolerating po/ Voiding without problems/ No n/v/ Bleeding is moderate/ Pain controlled withnarcotic analgesics including Percocet    O:  A & O x 3 well/ VS: Blood pressure 114/70, pulse 83, temperature 98.4 F (36.9 C), temperature source Oral, resp. rate 14, height 5\' 2"  (1.575 m), weight 78.019 kg (172 lb), SpO2 100 %, unknown if currently breastfeeding.  LABS: No results found for this or any previous visit (from the past 24 hour(s)).  I&O: I/O last 3 completed shifts: In: -  Out: 900 [Emesis/NG output:900]      Lungs: chest clear, no wheezing, rales, normal symmetric air entry, Heart exam - S1, S2 normal, no murmur, no gallop, rate regular  Heart: regular rate and rhythm, S1, S2 normal, no murmur, click, rub or gallop  Abdomen: benign non-tender, without masses or organomegaly palpable  Perineum: is normal  Lochia: mod   Extremities:edema 2+    A/P: POD # 3/PPD # 3 [id number] s/p C/S. 2) Peripheral edema  Doing well  Continue routine post partum orders  D/c instructions reviewed. D/c med reviewed( percocet, HCTZ, Motrin, PNV, OTC iron_ Staple removal 11/23 office. F/u 6 weeks postpartum

## 2014-03-11 NOTE — Discharge Summary (Signed)
Obstetric Discharge Summary Reason for Admission: rupture of membranes and Breech presentation, Ptl, twin gestation Prenatal Procedures: ultrasound Intrapartum Procedures: cesarean: low cervical, transverse Postpartum Procedures: antibiotics, Rho(D) Ig and T dap Complications-Operative and Postpartum: none HEMOGLOBIN  Date Value Ref Range Status  03/09/2014 8.9* 12.0 - 15.0 g/dL Final    Comment:    DELTA CHECK NOTED REPEATED TO VERIFY    HCT  Date Value Ref Range Status  03/09/2014 26.5* 36.0 - 46.0 % Final    Physical Exam:  General: alert, cooperative and no distress Lochia: appropriate Uterine Fundus: firm Incision: healing well DVT Evaluation: No evidence of DVT seen on physical exam.   hospital course: pt presented to [facility] after PPROM. She was found to be 5 cm dilated with frank breech presentation of twin A. Second twin was transverse lie. Pt underwent emergency C/S( see operative report). Pt had postop course notable for transient elevated temp . Pt received additional dose of prophylactic antibiotics due to stat surgery. She was instructed on incentive spirometry and early ambulation. ucx was sent. Placental path x2 was benign. Pt had otherwise unremarkable postop course  Discharge Diagnoses: Premature labor and   PPROM. twin gestation @ 31 5/7 weeks, preterm delivered. breech/transverse presentation, postoperative anemia, Rh negative  Discharge Information: Date: 03/11/2014 Activity: pelvic rest Diet: routine Medications: PNV, Ibuprofen, Percocet and HCTZ, OTC Fe Condition: stable Instructions: refer to practice specific booklet Discharge to: home   Newborn Data:   Abrar, Koone [[id number]]  Live born female  Birth Weight: 3 lb 6.7 oz (1551 g) APGAR: 8, 5,8   Giamarie, Bueche [[id number]]  Live born female  Birth Weight: 3 lb 6.7 oz (1551 g) APGAR: 8, 5,8  Babies in [place] 03/11/2014, 12:00 PM

## 2014-03-11 NOTE — Lactation Note (Signed)
This note was copied from the chart of [employer]. Lactation Consultation Note  Patient Name: Rose Bennett RFVOH'K Date: 03/11/2014 Reason for consult: Follow-up assessment;NICU baby  Babies are 34 hours of life. Mom reports that she is getting ounces of milk each time she pumps now. Enc mom to continue pumping 8X/day, once at night, and hand express afterwards. Mom states that she has DEBP at home. Discussed using hospital DEBP kit upstairs in NICU when she visits the babies. Mom aware how to transport milk to hospital. Mom aware of OP/BFSG and [place] phone line services. Enc mom to call for assistance as needed. Enc OP appointment for follow-up assistance with latching and tandem nursing as needed. Enc to request [facility] assistance as needed while babies in NICU as well. Maternal Data    Feeding Feeding Type: Breast Milk with Formula added Length of feed: 30 min  LATCH Score/Interventions                      Lactation Tools Discussed/Used     Consult Status Consult Status: Complete    Rykar Lebleu 03/11/2014, 11:17 AM

## 2014-03-13 NOTE — Progress Notes (Signed)
Ur chart review completed.  

## 2014-03-13 NOTE — Progress Notes (Signed)
Clinical Social Work Department BRIEF PSYCHOSOCIAL ASSESSMENT 05-29-2013  Patient:  Rose Bennett,Rose Bennett     Account Number:  [id number]     Admit date:  February 12, 2014  Clinical Social Worker:  Barbarann Ehlers  Date/Time:  03/28/2014 12:30 PM  Referred by:    Date Referred:    Other Referral:   No referral-NICU admission   Interview type:  Patient Other interview type:    PSYCHOSOCIAL DATA Living Status:  FAMILY Admitted from facility:   Level of care:   Primary support name:  Delicia Berens Primary support relationship to patient:  SPOUSE Degree of support available:   [place] support system of family and friends    CURRENT CONCERNS Current Concerns  None Noted   Other Concerns:    SOCIAL WORK ASSESSMENT / PLAN CSW met with MOB in her third floor room/320 to introduce myself, complete assessment due to admission of twins to NICU at 32 weeks and to offer support.  CSW explained ongoing support services offered by NICU CSW and gave contact information.  CSW discussed PPD signs and symptoms and asked that MOB contact CSW and or her OB if symptoms arise or if she has concerns about her emotions at any time.  CSW evaluated supports and supplies.  CSW has no social concerns at this time and identifies no barriers to discharge when babies are medically ready.  CSW feels MOB is coping very well with the situation and seems to have a very good understanding of her daughters' medical situations.   Assessment/plan status:  Psychosocial Support/Ongoing Assessment of Needs Other assessment/ plan:   Information/referral to community resources:   no referral needs noted at this time.    PATIENT'S/FAMILY'S RESPONSE TO PLAN OF CARE: MOB states she and babies are doing very well and while their birth was unexpected when it happened, she is thankful they are doing so well and taking things in stride.  She states she has a great support system and that she and her husband are already prepared for  babies at home.  She states she has not yet chosen a pediatrician, but has a list and will make this decision soon.  She states no questions, concerns or needs for CSW at this time and thanked CSW for the visit.

## 2014-04-03 ENCOUNTER — Ambulatory Visit: Payer: Self-pay

## 2014-04-03 NOTE — Lactation Note (Signed)
This note was copied from the chart of [employer]. Lactation Consultation Note Assisted with Evie's first attempt latching.  Positioned baby in cross cradle hold and instructed mom how to support her breast.  Baby opened wide and latched easily with good breast compression.  She was able to suck several times before slipping off.  Baby relatched a few times and actively nursed for total of 8 minutes before becoming sleepy.  Vital signs remained stable during feeding.  Discussed the breastfeeding process with a preterm baby.  Reassured mom that feedings will become more effective as baby nears term.  Praised mom for all her pumping efforts and great supply.  Patient Name: Rose Bennett OEUMP'N Date: 04/03/2014 Reason for consult: Follow-up assessment;NICU baby;Infant < 6lbs   Maternal Data    Feeding Feeding Type: Breast Fed Length of feed: 8 min  LATCH Score/Interventions Latch: Repeated attempts needed to sustain latch, nipple held in mouth throughout feeding, stimulation needed to elicit sucking reflex.  Audible Swallowing: A few with stimulation  Type of Nipple: Everted at rest and after stimulation  Comfort (Breast/Nipple): Soft / non-tender     Hold (Positioning): Assistance needed to correctly position infant at breast and maintain latch. Intervention(s): Breastfeeding basics reviewed;Support Pillows;Position options  LATCH Score: 7  Lactation Tools Discussed/Used     Consult Status Consult Status: PRN    Ave Filter 04/03/2014, 4:10 PM

## 2014-04-03 NOTE — Lactation Note (Signed)
This note was copied from the chart of [employer]. Lactation Consultation Note  Met with mom in the NICU.  She is pumping on a regular basis and has an excellent supply.  Baby A is bottle feeding well and could start going to breast.  Encouraged mom to ask nurse to call us when she is ready for assist.  Patient Name: Rose Bennett Date: 04/03/2014     Maternal Data    Feeding Feeding Type: Breast Milk Length of feed: 60 min  LATCH Score/Interventions                      Lactation Tools Discussed/Used     Consult Status      Ave Filter 04/03/2014, 2:27 PM

## 2014-04-04 ENCOUNTER — Ambulatory Visit: Payer: Self-pay

## 2014-04-04 NOTE — Lactation Note (Signed)
This note was copied from the chart of [employer]. Lactation Consultation Note  Assisted with latching Evie to breast.  Baby opens wide and latches briefly with active sucking then slips off.  Relatched a few times.  20 mm nipple shield applied and baby was working on latching but mom did not like using the shield.  Reminded mom to continue to stay patient and continue to put baby to breast once per day.  Patient Name: Rose Bennett GYJEH'U Date: 04/04/2014 Reason for consult: Follow-up assessment;NICU baby   Maternal Data    Feeding Feeding Type: Breast Fed Length of feed: 5 min  LATCH Score/Interventions Latch: Repeated attempts needed to sustain latch, nipple held in mouth throughout feeding, stimulation needed to elicit sucking reflex. Intervention(s): Adjust position;Assist with latch;Breast massage;Breast compression  Audible Swallowing: A few with stimulation Intervention(s): Alternate breast massage  Type of Nipple: Everted at rest and after stimulation  Comfort (Breast/Nipple): Soft / non-tender     Hold (Positioning): Assistance needed to correctly position infant at breast and maintain latch. Intervention(s): Breastfeeding basics reviewed;Support Pillows  LATCH Score: 7  Lactation Tools Discussed/Used     Consult Status      Ave Filter 04/04/2014, 3:43 PM

## 2015-09-19 ENCOUNTER — Ambulatory Visit (HOSPITAL_COMMUNITY)
Admission: EM | Admit: 2015-09-19 | Discharge: 2015-09-19 | Disposition: A | Discharge status: Home / Self Care | Payer: Managed Care, Other (non HMO) | Attending: Family Medicine | Admitting: Family Medicine

## 2015-09-19 DIAGNOSIS — A09 Infectious gastroenteritis and colitis, unspecified: Secondary | ICD-10-CM | POA: Diagnosis not present

## 2015-09-19 DIAGNOSIS — K529 Noninfective gastroenteritis and colitis, unspecified: Secondary | ICD-10-CM

## 2015-09-19 MED ORDER — ONDANSETRON HCL 4 MG PO TABS: 4.0000 mg | ORAL_TABLET | Freq: Four times a day (QID) | ORAL | Status: DC

## 2015-09-19 NOTE — ED Notes (Signed)
Pt was in [place] recently and came back Monday night and has had nausea and diarrhea since Pt concerned that she may have caught something from ice in mixed drinks and does not want to pass it to her 51 month old twins

## 2015-09-19 NOTE — ED Provider Notes (Signed)
CSN: [id number]     Arrival date & time 09/19/15  1519 History   First MD Initiated Contact with Patient 09/19/15 1540     Chief Complaint  Patient presents with  . Nausea  . Diarrhea   (Consider location/radiation/quality/duration/timing/severity/associated sxs/prior Treatment) Patient is a 35 y.o. female presenting with diarrhea. The history is provided by the patient.  Diarrhea Quality:  Watery Severity:  Moderate Onset quality:  Sudden (onset with other wedding party members while in [place] over this weekend.) Duration:  4 days Timing:  Constant Progression:  Unchanged Relieved by:  None tried Worsened by:  Nothing tried Ineffective treatments:  None tried Associated symptoms: abdominal pain and vomiting   Associated symptoms: no fever   Associated symptoms comment:  N/v/d. No blood or fever   Past Medical History  Diagnosis Date  . Tachycardia   . Dermatitis   . Lipoma   . Endometriosis   . POTS (postural orthostatic tachycardia syndrome)   . Hx of echocardiogram     a. echo 12/13:  mild LVH, EF 55%, normal diast fn   Past Surgical History  Procedure Laterality Date  . Laparotomy      x5  . Appendectomy    . Polypectomy      ovarian  . Laporoscopy      seven surgeries  . Cesarean section N/A 03/08/2014    Procedure: CESAREAN SECTION;  Surgeon: Marvene Staff, MD;  Location: [facility];  Service: Obstetrics;  Laterality: N/A;   Family History  Problem Relation Age of Onset  . Heart disease Mother   . Heart disease Sister    Social History  Substance Use Topics  . Smoking status: Never Smoker   . Smokeless tobacco: Not on file  . Alcohol Use: Yes     Comment: occasional before preg   OB History    Gravida Para Term Preterm AB TAB SAB Ectopic Multiple Living   1 1  1     1 1      Review of Systems  Constitutional: Negative for fever.  Gastrointestinal: Positive for vomiting, abdominal pain and diarrhea.    Allergies  Adhesive; Heparin; Other;  Pineapple; and Tomato  Home Medications   Prior to Admission medications   Medication Sig Start Date End Date Taking? Authorizing Provider  hydrochlorothiazide (MICROZIDE) 12.5 MG capsule Take 1 capsule (12.5 mg total) by mouth daily. 03/11/14   Servando Salina, MD  ibuprofen (ADVIL,MOTRIN) 600 MG tablet Take 1 tablet (600 mg total) by mouth every 6 (six) hours. 03/11/14   Sheronette Cousins, MD  ondansetron (ZOFRAN) 4 MG tablet Take 1 tablet (4 mg total) by mouth every 6 (six) hours. Prn n/v. 09/19/15   Billy Fischer, MD  oxyCODONE-acetaminophen (PERCOCET/ROXICET) 5-325 MG per tablet Take 1 tablet by mouth every 4 (four) hours as needed (for pain scale less than 7). 03/11/14   Servando Salina, MD  Prenatal Vit-Fe Fumarate-FA (PRENATAL MULTIVITAMIN) TABS tablet Take 1 tablet by mouth daily at 12 noon.    Historical Provider, MD   Meds Ordered and Administered this Visit  Medications - No data to display  BP 105/73 mmHg  Pulse 99  Temp(Src) 98.8 F (37.1 C) (Oral)  Resp 16  SpO2 100%  LMP 08/21/2015 (Approximate) No data found.   Physical Exam  Constitutional: She is oriented to person, place, and time. She appears well-developed and well-nourished. No distress.  HENT:  Right Ear: External ear normal.  Left Ear: External ear normal.  Mouth/Throat: Oropharynx  is clear and moist.  Neck: Normal range of motion. Neck supple.  Cardiovascular: Normal heart sounds.   Pulmonary/Chest: Effort normal and breath sounds normal.  Abdominal: Soft. Bowel sounds are normal. She exhibits no distension and no mass. There is tenderness. There is no rebound and no guarding.  Lymphadenopathy:    She has cervical adenopathy.  Neurological: She is alert and oriented to person, place, and time.  Skin: Skin is warm and dry.  Nursing note and vitals reviewed.   ED Course  Procedures (including critical care time)  Labs Review Labs Reviewed  GASTROINTESTINAL PANEL BY PCR, STOOL (REPLACES  STOOL CULTURE)    Imaging Review No results found.   Visual Acuity Review  Right Eye Distance:   Left Eye Distance:   Bilateral Distance:    Right Eye Near:   Left Eye Near:    Bilateral Near:         MDM   1. Gastroenteritis, infectious, presumed    Pt unable to produce spec while here, will return when able.    Billy Fischer, MD 09/19/15 [phone]

## 2015-10-16 ENCOUNTER — Other Ambulatory Visit: Payer: Self-pay | Admitting: Obstetrics and Gynecology

## 2015-10-16 DIAGNOSIS — R198 Other specified symptoms and signs involving the digestive system and abdomen: Secondary | ICD-10-CM

## 2015-10-16 DIAGNOSIS — Z8742 Personal history of other diseases of the female genital tract: Secondary | ICD-10-CM

## 2015-10-16 DIAGNOSIS — R222 Localized swelling, mass and lump, trunk: Secondary | ICD-10-CM

## 2015-10-24 ENCOUNTER — Ambulatory Visit
Admission: RE | Admit: 2015-10-24 | Discharge: 2015-10-24 | Disposition: A | Discharge status: Home / Self Care | Payer: Managed Care, Other (non HMO) | Source: Ambulatory Visit | Attending: Obstetrics and Gynecology | Admitting: Obstetrics and Gynecology

## 2015-10-24 DIAGNOSIS — R222 Localized swelling, mass and lump, trunk: Secondary | ICD-10-CM

## 2015-10-24 DIAGNOSIS — Z8742 Personal history of other diseases of the female genital tract: Secondary | ICD-10-CM

## 2015-10-24 DIAGNOSIS — R198 Other specified symptoms and signs involving the digestive system and abdomen: Secondary | ICD-10-CM

## 2015-10-24 MED ORDER — IOPAMIDOL (ISOVUE-300) INJECTION 61%
100.0000 mL | Freq: Once | INTRAVENOUS | Status: AC | PRN
  Administered 2015-10-24: 100 mL via INTRAVENOUS

## 2016-01-29 ENCOUNTER — Encounter (HOSPITAL_COMMUNITY): Payer: Self-pay

## 2016-01-29 ENCOUNTER — Inpatient Hospital Stay (HOSPITAL_COMMUNITY): Payer: Managed Care, Other (non HMO)

## 2016-01-29 ENCOUNTER — Inpatient Hospital Stay (HOSPITAL_COMMUNITY)
Admission: AD | Admit: 2016-01-29 | Discharge: 2016-01-29 | Disposition: A | Discharge status: Home / Self Care | Payer: Managed Care, Other (non HMO) | Source: Ambulatory Visit | Attending: Obstetrics and Gynecology | Admitting: Obstetrics and Gynecology

## 2016-01-29 DIAGNOSIS — N83519 Torsion of ovary and ovarian pedicle, unspecified side: Secondary | ICD-10-CM

## 2016-01-29 DIAGNOSIS — N83202 Unspecified ovarian cyst, left side: Secondary | ICD-10-CM

## 2016-01-29 DIAGNOSIS — N83292 Other ovarian cyst, left side: Secondary | ICD-10-CM | POA: Insufficient documentation

## 2016-01-29 DIAGNOSIS — R102 Pelvic and perineal pain: Secondary | ICD-10-CM | POA: Diagnosis present

## 2016-01-29 DIAGNOSIS — R1030 Lower abdominal pain, unspecified: Secondary | ICD-10-CM

## 2016-01-29 LAB — URINE MICROSCOPIC-ADD ON

## 2016-01-29 LAB — URINALYSIS, ROUTINE W REFLEX MICROSCOPIC
Bilirubin Urine: NEGATIVE
Glucose, UA: NEGATIVE mg/dL
KETONES UR: NEGATIVE mg/dL
LEUKOCYTES UA: NEGATIVE
NITRITE: NEGATIVE
PH: 6 (ref 5.0–8.0)
Protein, ur: NEGATIVE mg/dL

## 2016-01-29 LAB — POCT PREGNANCY, URINE: Preg Test, Ur: NEGATIVE

## 2016-01-29 MED ORDER — OXYCODONE-ACETAMINOPHEN 5-325 MG PO TABS: 1.0000 | ORAL_TABLET | ORAL | 0 refills | Status: DC | PRN

## 2016-01-29 MED ORDER — OXYCODONE-ACETAMINOPHEN 5-325 MG PO TABS
2.0000 | ORAL_TABLET | Freq: Once | ORAL | Status: AC
  Administered 2016-01-29: 2 via ORAL
  Filled 2016-01-29: qty 2

## 2016-01-29 MED ORDER — KETOROLAC TROMETHAMINE 60 MG/2ML IM SOLN
60.0000 mg | Freq: Once | INTRAMUSCULAR | Status: AC
  Administered 2016-01-29: 60 mg via INTRAMUSCULAR
  Filled 2016-01-29: qty 2

## 2016-01-29 NOTE — MAU Provider Note (Signed)
History     Chief Complaint  Patient presents with  . Abdominal Pain   35 yo G1P0102 MBF presents for evaluation of increasing abdominal pain not responsive to ultram. Pt is known to have large ovarian cyst and hx stage iv pelvic endometriosis. Pt feels cyst has gotten bigger and more tender. Pt has been on femara and norethindrone while awaiting surgery. Pain is constant sharp nonradiationg. No associated n/v/fever. Denies urinary sx. Pt was deferring surgery due to insurance coverage  OB History    Gravida Para Term Preterm AB Living   1 1   1   1    SAB TAB Ectopic Multiple Live Births         1 2      Past Medical History:  Diagnosis Date  . Dermatitis   . Endometriosis   . Hx of echocardiogram    a. echo 12/13:  mild LVH, EF 55%, normal diast fn  . Lipoma   . POTS (postural orthostatic tachycardia syndrome)   . Tachycardia     Past Surgical History:  Procedure Laterality Date  . APPENDECTOMY    . CESAREAN SECTION N/A 03/08/2014   Procedure: CESAREAN SECTION;  Surgeon: Marvene Staff, MD;  Location: [facility];  Service: Obstetrics;  Laterality: N/A;  . LAPAROTOMY     x5  . laporoscopy     seven surgeries  . POLYPECTOMY     ovarian    Family History  Problem Relation Age of Onset  . Heart disease Mother   . Heart disease Sister     Social History  Substance Use Topics  . Smoking status: Never Smoker  . Smokeless tobacco: Never Used  . Alcohol use Yes     Comment: occasional before preg    Allergies:  Allergies  Allergen Reactions  . Adhesive [Tape] Itching and Swelling  . Heparin Swelling  . Other Rash    Dermabond, surgical glue  . Pineapple Hives and Rash  . Tomato Hives and Rash    Prescriptions Prior to Admission  Medication Sig Dispense Refill Last Dose  .    0   .    0   . Ultram 50mg   One tablet every six hours  0   .   30 tablet 0   . Prenatal Vit-Fe Fumarate-FA (PRENATAL MULTIVITAMIN) TABS tablet Take 1 tablet by mouth daily at 12  noon.   03/07/2014 at Unknown time     Physical Exam   Blood pressure 114/71, pulse 80, temperature 98.4 F (36.9 C), resp. rate 16, height 5\' 2"  (1.575 m), weight 68.5 kg (151 lb), last menstrual period 12/24/2015, unknown if currently breastfeeding.  General appearance: alert, cooperative and mild distress Heart: regular rate and rhythm, S1, S2 normal, no murmur, click, rub or gallop Abdomen: soft active BS. tender , no rebound. Palp left cystic mass . Incision: pfannenstiel scar Pelvic: external genitalia normal  Bimanual exam: cervix closed/long Posterior palp nodularity but rectal vault full of stool Uterus sl enlarged non mobile Adnexa  Left fullness /mass tenderness~8 cm Right adnexa nontender Extremity no edema ED Course  IMP: LLQ pain Left ovarian cyst P) Toradol IM now. Sonogram r/o torsion and/or increasing size.  Pain med did not help. 2 percocet given.  MDM  addendum:  US Transvaginal Non-ob  Result Date: 01/29/2016 CLINICAL DATA:  35 year old female with pelvic pain. Personal history of endometriosis status post C-section and laparoscopy. Initial encounter. LMP 12/24/2015. EXAM: TRANSABDOMINAL AND TRANSVAGINAL ULTRASOUND OF PELVIS DOPPLER  ULTRASOUND OF OVARIES TECHNIQUE: Both transabdominal and transvaginal ultrasound examinations of the pelvis were performed. Transabdominal technique was performed for global imaging of the pelvis including uterus, ovaries, adnexal regions, and pelvic cul-de-sac. It was necessary to proceed with endovaginal exam following the transabdominal exam to visualize the ovaries. Color and duplex Doppler ultrasound was utilized to evaluate blood flow to the ovaries. COMPARISON:  CT Abdomen and Pelvis 10/24/2015 FINDINGS: Uterus Measurements: 8.5 x 5.0 x 5.9 cm. No fibroids or other mass visualized. Endometrium Thickness: 13 mm.  No focal abnormality visualized. Right ovary Measurements: 3.0 x 1.9 x 2.0 cm. Normal appearance/no adnexal mass. Left  ovary Measurements: 8.5 x 5.3 x 6.3 cm. Demonstrated trans abdominally there is a large 8.6 x 5.6 x 6.6 multi cystic lesion with thin intervening vascular septation (images 28-31). About 50% of the lesion (5-6 cm diameter) is composed of simple appearing fluid. The other 4-5 cm of the multi-cystic lesion is hypoechoic with a reticular pattern of internal echoes. In overall size the lesion has not significantly changed since July. Pulsed Doppler evaluation of both ovaries demonstrates normal low-resistance arterial and venous waveforms. Other findings No abnormal free fluid. IMPRESSION: 1. No evidence of ovarian torsion. 2. Persistent 8.5 cm multi-cystic lesion of the left ovary since July. Sonographically this resembles a simple cyst combined with a hemorrhagic cyst with an intervening thickened and vascular septation. In this clinical setting perhaps this represents an unusual endometrioma. Further characterization with pelvis MRI (gyn protocol without and with contrast) may be valuable. Electronically Signed   By: Genevie Ann M.D.   On: 01/29/2016 13:08   US Pelvis Complete  Result Date: 01/29/2016 CLINICAL DATA:  35 year old female with pelvic pain. Personal history of endometriosis status post C-section and laparoscopy. Initial encounter. LMP 12/24/2015. EXAM: TRANSABDOMINAL AND TRANSVAGINAL ULTRASOUND OF PELVIS DOPPLER ULTRASOUND OF OVARIES TECHNIQUE: Both transabdominal and transvaginal ultrasound examinations of the pelvis were performed. Transabdominal technique was performed for global imaging of the pelvis including uterus, ovaries, adnexal regions, and pelvic cul-de-sac. It was necessary to proceed with endovaginal exam following the transabdominal exam to visualize the ovaries. Color and duplex Doppler ultrasound was utilized to evaluate blood flow to the ovaries. COMPARISON:  CT Abdomen and Pelvis 10/24/2015 FINDINGS: Uterus Measurements: 8.5 x 5.0 x 5.9 cm. No fibroids or other mass visualized.  Endometrium Thickness: 13 mm.  No focal abnormality visualized. Right ovary Measurements: 3.0 x 1.9 x 2.0 cm. Normal appearance/no adnexal mass. Left ovary Measurements: 8.5 x 5.3 x 6.3 cm. Demonstrated trans abdominally there is a large 8.6 x 5.6 x 6.6 multi cystic lesion with thin intervening vascular septation (images 28-31). About 50% of the lesion (5-6 cm diameter) is composed of simple appearing fluid. The other 4-5 cm of the multi-cystic lesion is hypoechoic with a reticular pattern of internal echoes. In overall size the lesion has not significantly changed since July. Pulsed Doppler evaluation of both ovaries demonstrates normal low-resistance arterial and venous waveforms. Other findings No abnormal free fluid. IMPRESSION: 1. No evidence of ovarian torsion. 2. Persistent 8.5 cm multi-cystic lesion of the left ovary since July. Sonographically this resembles a simple cyst combined with a hemorrhagic cyst with an intervening thickened and vascular septation. In this clinical setting perhaps this represents an unusual endometrioma. Further characterization with pelvis MRI (gyn protocol without and with contrast) may be valuable. Electronically Signed   By: Genevie Ann M.D.   On: 01/29/2016 13:08   [place] Art/ven Flow Abd Pelv Doppler  Result Date: 01/29/2016  CLINICAL DATA:  35 year old female with pelvic pain. Personal history of endometriosis status post C-section and laparoscopy. Initial encounter. LMP 12/24/2015. EXAM: TRANSABDOMINAL AND TRANSVAGINAL ULTRASOUND OF PELVIS DOPPLER ULTRASOUND OF OVARIES TECHNIQUE: Both transabdominal and transvaginal ultrasound examinations of the pelvis were performed. Transabdominal technique was performed for global imaging of the pelvis including uterus, ovaries, adnexal regions, and pelvic cul-de-sac. It was necessary to proceed with endovaginal exam following the transabdominal exam to visualize the ovaries. Color and duplex Doppler ultrasound was utilized to evaluate  blood flow to the ovaries. COMPARISON:  CT Abdomen and Pelvis 10/24/2015 FINDINGS: Uterus Measurements: 8.5 x 5.0 x 5.9 cm. No fibroids or other mass visualized. Endometrium Thickness: 13 mm.  No focal abnormality visualized. Right ovary Measurements: 3.0 x 1.9 x 2.0 cm. Normal appearance/no adnexal mass. Left ovary Measurements: 8.5 x 5.3 x 6.3 cm. Demonstrated trans abdominally there is a large 8.6 x 5.6 x 6.6 multi cystic lesion with thin intervening vascular septation (images 28-31). About 50% of the lesion (5-6 cm diameter) is composed of simple appearing fluid. The other 4-5 cm of the multi-cystic lesion is hypoechoic with a reticular pattern of internal echoes. In overall size the lesion has not significantly changed since July. Pulsed Doppler evaluation of both ovaries demonstrates normal low-resistance arterial and venous waveforms. Other findings No abnormal free fluid. IMPRESSION: 1. No evidence of ovarian torsion. 2. Persistent 8.5 cm multi-cystic lesion of the left ovary since July. Sonographically this resembles a simple cyst combined with a hemorrhagic cyst with an intervening thickened and vascular septation. In this clinical setting perhaps this represents an unusual endometrioma. Further characterization with pelvis MRI (gyn protocol without and with contrast) may be valuable. Electronically Signed   By: Genevie Ann M.D.   On: 01/29/2016 13:08   Reviewed findings with pt.  No big change in cyst. Will need to disc with Dr Feliberto Harts regarding aspiration of the cyst  on outpt basis. Pt agree with plan. Feels pain controlled enough to go home. Script for pain med given F/u after speaking with Dr Feliberto Harts .  Sharel Behne A, MD 11:54 AM 01/29/2016

## 2016-01-29 NOTE — MAU Note (Signed)
Has been having pain for weeks, but has gotten worse the last few days.

## 2016-01-29 NOTE — Discharge Instructions (Signed)
miralax and/or smooth move tea . Call if worsening pain

## 2016-02-03 ENCOUNTER — Encounter (HOSPITAL_COMMUNITY): Payer: Self-pay | Admitting: Obstetrics and Gynecology

## 2016-04-07 ENCOUNTER — Ambulatory Visit: Admit: 2016-04-07 | Payer: Managed Care, Other (non HMO) | Admitting: Obstetrics and Gynecology

## 2016-04-07 SURGERY — EXCISION, CYST, OVARY
Anesthesia: Choice

## 2019-02-24 ENCOUNTER — Other Ambulatory Visit: Payer: Self-pay

## 2019-02-24 DIAGNOSIS — Z20822 Contact with and (suspected) exposure to covid-19: Secondary | ICD-10-CM

## 2019-02-25 LAB — NOVEL CORONAVIRUS, NAA: SARS-CoV-2, NAA: NOT DETECTED

## 2020-01-16 ENCOUNTER — Encounter (HOSPITAL_COMMUNITY): Payer: Self-pay | Admitting: Emergency Medicine

## 2020-01-16 ENCOUNTER — Emergency Department (HOSPITAL_COMMUNITY)
Admission: EM | Admit: 2020-01-16 | Discharge: 2020-01-16 | Disposition: A | Discharge status: Home / Self Care | Payer: BC Managed Care – PPO | Attending: Emergency Medicine | Admitting: Emergency Medicine

## 2020-01-16 ENCOUNTER — Emergency Department (HOSPITAL_COMMUNITY): Payer: BC Managed Care – PPO

## 2020-01-16 DIAGNOSIS — R55 Syncope and collapse: Secondary | ICD-10-CM

## 2020-01-16 LAB — COMPREHENSIVE METABOLIC PANEL
ALT: 13 U/L (ref 0–44)
AST: 18 U/L (ref 15–41)
Albumin: 4.1 g/dL (ref 3.5–5.0)
Alkaline Phosphatase: 41 U/L (ref 38–126)
Anion gap: 9 (ref 5–15)
BUN: 8 mg/dL (ref 6–20)
CO2: 25 mmol/L (ref 22–32)
Calcium: 9.2 mg/dL (ref 8.9–10.3)
Chloride: 105 mmol/L (ref 98–111)
Creatinine, Ser: 0.8 mg/dL (ref 0.44–1.00)
GFR calc Af Amer: >60 mL/min (ref >60)
GFR calc non Af Amer: >60 mL/min (ref >60)
Glucose, Bld: 88 mg/dL (ref 70–99)
Potassium: 3.6 mmol/L (ref 3.5–5.1)
Sodium: 139 mmol/L (ref 135–145)
Total Bilirubin: 0.6 mg/dL (ref 0.3–1.2)
Total Protein: 7.7 g/dL (ref 6.5–8.1)

## 2020-01-16 LAB — CBC
HCT: 38 % (ref 36.0–46.0)
Hemoglobin: 11.7 g/dL — ABNORMAL LOW (ref 12.0–15.0)
MCH: 26.9 pg (ref 26.0–34.0)
MCHC: 30.8 g/dL (ref 30.0–36.0)
MCV: 87.4 fL (ref 80.0–100.0)
Platelets: 244 10*3/uL (ref 150–400)
RBC: 4.35 MIL/uL (ref 3.87–5.11)
RDW: 12.1 % (ref 11.5–15.5)
WBC: 5.1 10*3/uL (ref 4.0–10.5)
nRBC: 0 % (ref 0.0–0.2)

## 2020-01-16 LAB — I-STAT BETA HCG BLOOD, ED (MC, WL, AP ONLY): I-stat hCG, quantitative: <5 m[IU]/mL (ref <5)

## 2020-01-16 LAB — CBG MONITORING, ED
Glucose-Capillary: 77 mg/dL (ref 70–99)
Glucose-Capillary: 123 mg/dL — ABNORMAL HIGH (ref 70–99)
Glucose-Capillary: 111 mg/dL — ABNORMAL HIGH (ref 70–99)

## 2020-01-16 MED ORDER — MECLIZINE HCL 25 MG PO TABS
25.0000 mg | ORAL_TABLET | Freq: Once | ORAL | Status: AC
  Administered 2020-01-16: 25 mg via ORAL
  Filled 2020-01-16: qty 1

## 2020-01-16 MED ORDER — SODIUM CHLORIDE 0.9 % IV BOLUS
1000.0000 mL | Freq: Once | INTRAVENOUS | Status: AC
  Administered 2020-01-16: 1000 mL via INTRAVENOUS

## 2020-01-16 MED ORDER — MECLIZINE HCL 12.5 MG PO TABS: 12.5000 mg | ORAL_TABLET | Freq: Three times a day (TID) | ORAL | 0 refills | Status: DC | PRN

## 2020-01-16 NOTE — ED Notes (Signed)
Notified provider about CBG 77. Per provider feed pt. Provided pt w/ cheese, crackers and OJ. Tolerating well

## 2020-01-16 NOTE — ED Notes (Signed)
Rose Bennett [phone] Husband Would like an update.

## 2020-01-16 NOTE — Discharge Instructions (Signed)
You are seen in the emergency department today after passing out.  Your blood work as well as your CT scans were overall reassuring.  Your blood sugar was on the lower end of normal which improved after you were given a snack.  Please be sure to stay well-hydrated, eat 3 meals per day and snacks as needed.  Sending her with meclizine to take every 8 hours as needed for dizziness.  We have prescribed you new medication(s) today. Discuss the medications prescribed today with your pharmacist as they can have adverse effects and interactions with your other medicines including over the counter and prescribed medications. Seek medical evaluation if you start to experience new or abnormal symptoms after taking one of these medicines, seek care immediately if you start to experience difficulty breathing, feeling of your throat closing, facial swelling, or rash as these could be indications of a more serious allergic reaction  Please talk with your primary care provider within 3 days.  Return to the ER for new or worsening symptoms including but not limited to recurrence of passing out, persistent dizziness, inability to keep fluids down, fever, chest pain, shortness of breath, or any other concerns.

## 2020-01-16 NOTE — ED Notes (Signed)
Reviewed discharge instructions with patient. Follow-up care and medications reviewed. Patient verbalized understanding. Patient A&Ox4, VSS upon discharge. 

## 2020-01-16 NOTE — ED Provider Notes (Signed)
[facility] EMERGENCY DEPARTMENT Provider Note   CSN: [id number] Arrival date & time: 01/16/20  7619     History Chief Complaint  Patient presents with  . Loss of Consciousness    Rose Bennett is a 39 y.o. female with a history of endometriosis & POTS who presents to the ED S/p syncope episode shortly PTA. Patient states she woke up feeling a bit dizzy & nauseous with position changes, but decided to push through and go to work. 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  Per witnesses she fell forward and struck the right side of her face/head.  The next issue members is EMS staff being there.  She states she currently still feels a bit dizzy/lightheaded with position changes especially when they checked orthostatic vital signs, also is triggered a bit with turning of the head.  No other alleviating or aggravating factors.  She is also having pain to the right side of her head that is aching in nature.  She denies visual disturbance, numbness, tingling, focal weakness, chest pain, dyspnea, or palpitations.  Of note she received her 2nd dose of the COVID 19 vaccine 09/18, 1 to 2 days after that she had fever, fatigue, which seem to be resolving but she then had some vomiting.  She was seen by [facility] and had a negative Covid test.  Over the weekend she did start her period, has a history of irregular menses w/ history of endometriosis, this has been fairly heavy but seems to be slowing a bit today.  HPI     Past Medical History:  Diagnosis Date  . Dermatitis   . Endometriosis   . Hx of echocardiogram    a. echo 12/13:  mild LVH, EF 55%, normal diast fn  . Lipoma   . POTS (postural orthostatic tachycardia syndrome)   . Tachycardia     Patient Active Problem List   Diagnosis Date Noted  . [redacted] weeks gestation of pregnancy     Past Surgical History:  Procedure  Laterality Date  . APPENDECTOMY    . CESAREAN SECTION N/A 03/08/2014   Procedure: CESAREAN SECTION;  Surgeon: Marvene Staff, MD;  Location: [facility];  Service: Obstetrics;  Laterality: N/A;  . LAPAROTOMY     x5  . laporoscopy     seven surgeries  . POLYPECTOMY     ovarian     OB History    Gravida  1   Para  1   Term      Preterm  1   AB      Living  1     SAB      TAB      Ectopic      Multiple  1   Live Births  2           Family History  Problem Relation Age of Onset  . Heart disease Mother   . Heart disease Sister     Social History   Tobacco Use  . Smoking status: Never Smoker  . Smokeless tobacco: Never Used  Substance Use Topics  . Alcohol use: Yes    Comment: occasional before preg  . Drug use: No    Home Medications Prior to Admission medications   Medication Sig Start Date End Date Taking? Authorizing Provider  Multiple Vitamins-Minerals (ADULT ONE DAILY GUMMIES PO) Take 1 tablet by mouth daily.  [provider]  oxyCODONE-acetaminophen (PERCOCET/ROXICET) 5-325 MG tablet Take 1-2 tablets by mouth every 4 (four) hours as needed for severe pain. 01/29/16   Servando Salina, MD  traMADol (ULTRAM) 50 MG tablet Take 50 mg by mouth every 6 (six) hours as needed for moderate pain.    [provider]    Allergies    Adhesive [tape], Heparin, Other, Pineapple, and Tomato  Review of Systems   Review of Systems  Constitutional: Positive for fatigue and fever (resolved). Negative for chills.  Respiratory: Negative for cough and shortness of breath.   Cardiovascular: Negative for chest pain.  Gastrointestinal: Positive for nausea and vomiting (resolved). Negative for abdominal pain.  Genitourinary: Positive for vaginal bleeding (menses).  Neurological: Positive for dizziness, syncope, light-headedness and headaches (s/p head injury with syncope).  All other systems reviewed and are negative.   Physical  Exam Updated Vital Signs BP 119/78 (BP Location: Right Arm)   Pulse 72   Temp 97.8 F (36.6 C) (Oral)   Resp 12   SpO2 100%   Physical Exam Vitals and nursing note reviewed.  Constitutional:      General: She is not in acute distress.    Appearance: She is well-developed. She is not toxic-appearing.  HENT:     Head: Normocephalic.      Right Ear: No hemotympanum.     Left Ear: No hemotympanum.     Nose: Nose normal.     Mouth/Throat:     Pharynx: Uvula midline.  Eyes:     General:        Right eye: No discharge.        Left eye: No discharge.     Extraocular Movements: Extraocular movements intact.     Conjunctiva/sclera: Conjunctivae normal.     Pupils: Pupils are equal, round, and reactive to light.     Comments: No rotational or vertical nystagmus.   Cardiovascular:     Rate and Rhythm: Normal rate and regular rhythm.     Heart sounds: No murmur heard.   Pulmonary:     Effort: Pulmonary effort is normal. No respiratory distress.     Breath sounds: Normal breath sounds. No wheezing, rhonchi or rales.  Abdominal:     General: There is no distension.     Palpations: Abdomen is soft.     Tenderness: There is no abdominal tenderness.  Musculoskeletal:     Cervical back: Normal range of motion and neck supple. No spinous process tenderness.  Skin:    General: Skin is warm and dry.     Findings: No rash.  Neurological:     Mental Status: She is alert.     Comments: Clear speech.  CN II through XII grossly intact.  Sensation grossly intact bilateral upper and lower extremities.  5-5 strength with grip strength.  Intact finger-to-nose.  Psychiatric:        Behavior: Behavior normal.     ED Results / Procedures / Treatments   Labs (all labs ordered are listed, but only abnormal results are displayed) Labs Reviewed  CBC - Abnormal; Notable for the following components:      Result Value   Hemoglobin 11.7 (*)    All other components within normal limits  CBG  MONITORING, ED - Abnormal; Notable for the following components:   Glucose-Capillary 123 (*)    All other components within normal limits  COMPREHENSIVE METABOLIC PANEL  CBG MONITORING, ED  I-STAT BETA HCG BLOOD, ED ([facility], [facility], [facility] ONLY)  EKG EKG Interpretation  Date/Time:  Monday January 16 2020 09:31:47 EDT Ventricular Rate:  71 PR Interval:  132 QRS Duration: 70 QT Interval:  370 QTC Calculation: 402 R Axis:   69 Text Interpretation: Normal sinus rhythm with sinus arrhythmia Normal ECG Confirmed by Pattricia Boss [phone]) on 01/16/2020 10:35:20 AM   Radiology CT Head Wo Contrast  Result Date: 01/16/2020 CLINICAL DATA:  Facial injury after fall. EXAM: CT HEAD WITHOUT CONTRAST CT MAXILLOFACIAL WITHOUT CONTRAST TECHNIQUE: Multidetector CT imaging of the head and maxillofacial structures were performed using the standard protocol without intravenous contrast. Multiplanar CT image reconstructions of the maxillofacial structures were also generated. COMPARISON:  None. FINDINGS: CT HEAD FINDINGS Brain: No evidence of acute infarction, hemorrhage, hydrocephalus, extra-axial collection or mass lesion/mass effect. Vascular: No hyperdense vessel or unexpected calcification. Skull: Normal. Negative for fracture or focal lesion. Other: None. CT MAXILLOFACIAL FINDINGS Osseous: No fracture or mandibular dislocation. No destructive process. Orbits: Negative. No traumatic or inflammatory finding. Sinuses: Clear. Soft tissues: Negative. IMPRESSION: 1. Normal head CT. 2. No abnormality seen in maxillofacial region. Electronically Signed   By: Marijo Conception M.D.   On: 01/16/2020 13:58   CT Maxillofacial WO CM  Result Date: 01/16/2020 CLINICAL DATA:  Facial injury after fall. EXAM: CT HEAD WITHOUT CONTRAST CT MAXILLOFACIAL WITHOUT CONTRAST TECHNIQUE: Multidetector CT imaging of the head and maxillofacial structures were performed using the standard protocol without intravenous contrast. Multiplanar CT image  reconstructions of the maxillofacial structures were also generated. COMPARISON:  None. FINDINGS: CT HEAD FINDINGS Brain: No evidence of acute infarction, hemorrhage, hydrocephalus, extra-axial collection or mass lesion/mass effect. Vascular: No hyperdense vessel or unexpected calcification. Skull: Normal. Negative for fracture or focal lesion. Other: None. CT MAXILLOFACIAL FINDINGS Osseous: No fracture or mandibular dislocation. No destructive process. Orbits: Negative. No traumatic or inflammatory finding. Sinuses: Clear. Soft tissues: Negative. IMPRESSION: 1. Normal head CT. 2. No abnormality seen in maxillofacial region. Electronically Signed   By: Marijo Conception M.D.   On: 01/16/2020 13:58    Procedures Procedures (including critical care time)  1:00 PM Cardiac monitoring reveals sinus @ rate of 78 BPM as reviewed and interpreted by me. Cardiac monitoring was ordered due to syncope and to monitor patient for dysrhythmia.   Medications Ordered in ED Medications  sodium chloride 0.9 % bolus 1,000 mL (has no administration in time range)  meclizine (ANTIVERT) tablet 25 mg (has no administration in time range)    ED Course  I have reviewed the triage vital signs and the nursing notes.  Pertinent labs & imaging results that were available during my care of the patient were reviewed by me and considered in my medical decision making (see chart for details).    MDM Rules/Calculators/A&P                         Patient presents to the ED for evaluation of syncope.  She is nontoxic and resting comfortably, vitals within normal limits.  She does have some swelling and bruising to the right forehead/temple area.  No other evidence of injury.  She is neurologically intact.  Some components are a bit concerning for peripheral vertigo given she describes her dizziness as the room spinning, however also may be dehydration related. Will give fluids and meclizine with plan for reassessment. Placed on  cardiac monitor.   Additional history obtained:  Additional history obtained from nursing note & chart review.   EKG: Sinus rhythm with a  sinus arrhythmia.    Orthostatic VS for the past 24 hrs:  BP- Lying Pulse- Lying BP- Sitting Pulse- Sitting BP- Standing at 0 minutes Pulse- Standing at 0 minutes  01/16/20 1125 119/83 66 121/82 67 114/83 71    Lab Tests:  I reviewed and interpreted labs, which included:  CBC: Improving anemia. CMP: Unremarkable.  11:47: CBG 77- will give patient something to eat/drink as well.   Imaging Studies ordered:  I ordered imaging studies which included CT head & maxillofacial, I independently visualized and interpreted imaging which showed No significant injury.   Cardiac monitoring reassuring.   On reevaluation patient is feeling much better, she is requesting to go home.  Repeat CBG increased. She is ambulatory, tolerating PO. denies any current dizziness.  Will discharge home with meclizine to take as needed with close PCP follow-up. I discussed results, treatment plan, need for follow-up, and return precautions with the patient. Provided opportunity for questions, patient confirmed understanding and is in agreement with plan.    Portions of this note were generated with Lobbyist. Dictation errors may occur despite best attempts at proofreading.  Final Clinical Impression(s) / ED Diagnoses Final diagnoses:  Syncope, unspecified syncope type    Rx / DC Orders ED Discharge Orders         Ordered    meclizine (ANTIVERT) 12.5 MG tablet  3 times daily PRN        01/16/20 1415           Dontrel Smethers, [place] R, PA-C 01/16/20 1422    Lennice Sites, DO 01/16/20 1502

## 2020-01-16 NOTE — ED Triage Notes (Signed)
Pt here from work with c/o syncopal episode  , pt had a recent negative covid test but was waiting on results of flu swab

## 2020-11-27 DIAGNOSIS — R82998 Other abnormal findings in urine: Secondary | ICD-10-CM | POA: Diagnosis not present

## 2020-12-23 ENCOUNTER — Encounter (HOSPITAL_COMMUNITY): Payer: Self-pay | Admitting: *Deleted

## 2020-12-23 ENCOUNTER — Emergency Department (HOSPITAL_COMMUNITY): Payer: 59

## 2020-12-23 ENCOUNTER — Emergency Department (HOSPITAL_COMMUNITY)
Admission: EM | Admit: 2020-12-23 | Discharge: 2020-12-24 | Disposition: A | Discharge status: Home / Self Care | Payer: 59 | Attending: Emergency Medicine | Admitting: Emergency Medicine

## 2020-12-23 ENCOUNTER — Other Ambulatory Visit: Payer: Self-pay

## 2020-12-23 DIAGNOSIS — N9489 Other specified conditions associated with female genital organs and menstrual cycle: Secondary | ICD-10-CM | POA: Insufficient documentation

## 2020-12-23 DIAGNOSIS — I951 Orthostatic hypotension: Secondary | ICD-10-CM | POA: Diagnosis not present

## 2020-12-23 DIAGNOSIS — R000 Tachycardia, unspecified: Secondary | ICD-10-CM | POA: Insufficient documentation

## 2020-12-23 DIAGNOSIS — R079 Chest pain, unspecified: Secondary | ICD-10-CM | POA: Diagnosis not present

## 2020-12-23 LAB — CBC
HCT: 37 % (ref 36.0–46.0)
Hemoglobin: 11.6 g/dL — ABNORMAL LOW (ref 12.0–15.0)
MCH: 26.9 pg (ref 26.0–34.0)
MCHC: 31.4 g/dL (ref 30.0–36.0)
MCV: 85.8 fL (ref 80.0–100.0)
Platelets: 283 10*3/uL (ref 150–400)
RBC: 4.31 MIL/uL (ref 3.87–5.11)
RDW: 13.2 % (ref 11.5–15.5)
WBC: 6.2 10*3/uL (ref 4.0–10.5)
nRBC: 0 % (ref 0.0–0.2)

## 2020-12-23 LAB — I-STAT BETA HCG BLOOD, ED (MC, WL, AP ONLY): I-stat hCG, quantitative: <5 m[IU]/mL (ref <5)

## 2020-12-23 LAB — TROPONIN I (HIGH SENSITIVITY): Troponin I (High Sensitivity): 3 ng/L (ref <18)

## 2020-12-23 LAB — BASIC METABOLIC PANEL
Anion gap: 6 (ref 5–15)
BUN: 17 mg/dL (ref 6–20)
CO2: 29 mmol/L (ref 22–32)
Calcium: 9.7 mg/dL (ref 8.9–10.3)
Chloride: 104 mmol/L (ref 98–111)
Creatinine, Ser: 0.91 mg/dL (ref 0.44–1.00)
GFR, Estimated: >60 mL/min (ref >60)
Glucose, Bld: 89 mg/dL (ref 70–99)
Potassium: 3.8 mmol/L (ref 3.5–5.1)
Sodium: 139 mmol/L (ref 135–145)

## 2020-12-23 MED ORDER — SODIUM CHLORIDE 0.9 % IV BOLUS
1000.0000 mL | Freq: Once | INTRAVENOUS | Status: AC
  Administered 2020-12-23: 1000 mL via INTRAVENOUS

## 2020-12-23 NOTE — ED Triage Notes (Signed)
The pt has had chest pai and intermittent tachycardia since yesterday  her phythym goes up and down on her apple watch    no previous history  lmp   last month

## 2020-12-24 ENCOUNTER — Emergency Department (HOSPITAL_COMMUNITY): Payer: 59

## 2020-12-24 DIAGNOSIS — R079 Chest pain, unspecified: Secondary | ICD-10-CM | POA: Diagnosis not present

## 2020-12-24 LAB — URINALYSIS, ROUTINE W REFLEX MICROSCOPIC
Bacteria, UA: NONE SEEN
Bilirubin Urine: NEGATIVE
Glucose, UA: NEGATIVE mg/dL
Hgb urine dipstick: NEGATIVE
Ketones, ur: 5 mg/dL — AB
Nitrite: NEGATIVE
Protein, ur: NEGATIVE mg/dL
Specific Gravity, Urine: 1.024 (ref 1.005–1.030)
pH: 5 (ref 5.0–8.0)

## 2020-12-24 LAB — TROPONIN I (HIGH SENSITIVITY): Troponin I (High Sensitivity): 3 ng/L (ref <18)

## 2020-12-24 MED ORDER — IOHEXOL 350 MG/ML SOLN
50.0000 mL | Freq: Once | INTRAVENOUS | Status: AC | PRN
  Administered 2020-12-24: 50 mL via INTRAVENOUS

## 2020-12-24 NOTE — ED Notes (Signed)
Provider aware, bedside

## 2020-12-24 NOTE — ED Notes (Signed)
Patient verbalizes understanding of discharge instructions. Opportunity for questioning and answers were provided. Armband removed by staff, pt discharged from ED and ambulated to lobby to return home.   

## 2020-12-24 NOTE — Discharge Instructions (Addendum)
Thank you for allowing me to care for you today in the Emergency Department.   You had positive orthostatic vital signs today, which means that your blood pressure was dropping with positional changes and was making asymptomatic.  This can cause some tachycardia.  You were treated with IV fluids.  Make sure that you drink at least 64 ounces of fluids daily.  More if you are exercising or out in the heat for long periods of time.  As we discussed, your CAT scan did not show a blood clot.  Please call to schedule a follow-up appoint with cardiology.  I have put the office information above.  Return to the emergency department if you have a persistently fast heart rate, if you pass out, if you having severe chest pain with respiratory distress, new numbness or weakness, sweating, or other new, concerning symptoms.

## 2020-12-24 NOTE — ED Notes (Signed)
Pt to CT

## 2020-12-24 NOTE — ED Provider Notes (Signed)
[facility] EMERGENCY DEPARTMENT Provider Note   CSN: [id number] Arrival date & time: 12/23/20  1925     History Chief Complaint  Patient presents with   Chest Pain    Rose Bennett is a 40 y.o. female with a history of POTS, and endometriosis who presents emergency department with a chief complaint of chest pain.  The patient reports left-sided, nonradiating, constant and chest pain that is characterized as pressure and tightness that is pleuritic.  She has been having some mild shortness of breath.  She reports that she became very lightheaded in triage and almost fell onto the nurse when she stood up.  She has also been having palpitations.  Since yesterday, she has been alerted by her apple watch that her heart rate has been increasing.  Heart rate has been as high as 156.  She initially noted increased heart rate to 131-140, but after some time the episodes would stop and then recur later.  She has not noted any aggravating or alleviating factors with the episodes of tachycardia.  No history of similar.  She denies fever, chills, URI symptoms, syncope, seizure-like activity, numbness, weakness, headache, neck pain or stiffness, leg swelling, vomiting, diarrhea.  She is unsure if she has not been drinking as much fluids or has been feeling more dehydrated over the last few days.  She reports that her father has a history of SVT.  The patient is followed by cardiology for POTS, but has never been diagnosed with an arrhythmia.  She reports that her biological mother passed away at the age of 34 from an unprovoked PE.  The history is provided by the patient and medical records. No language interpreter was used.      Past Medical History:  Diagnosis Date   Dermatitis    Endometriosis    Hx of echocardiogram    a. echo 12/13:  mild LVH, EF 55%, normal diast fn   Lipoma    POTS (postural orthostatic tachycardia syndrome)    Tachycardia     Patient Active Problem  List   Diagnosis Date Noted   [redacted] weeks gestation of pregnancy     Past Surgical History:  Procedure Laterality Date   APPENDECTOMY     CESAREAN SECTION N/A 03/08/2014   Procedure: CESAREAN SECTION;  Surgeon: Marvene Staff, MD;  Location: [facility];  Service: Obstetrics;  Laterality: N/A;   LAPAROTOMY     x5   laporoscopy     seven surgeries   POLYPECTOMY     ovarian     OB History     Gravida  1   Para  1   Term      Preterm  1   AB      Living  1      SAB      IAB      Ectopic      Multiple  1   Live Births  2           Family History  Problem Relation Age of Onset   Heart disease Mother    Heart disease Sister     Social History   Tobacco Use   Smoking status: Never   Smokeless tobacco: Never  Substance Use Topics   Alcohol use: Yes    Comment: occasional before preg   Drug use: No    Home Medications Prior to Admission medications   Medication Sig Start Date End Date Taking? Authorizing Provider  acetaminophen (  TYLENOL) 500 MG tablet Take 1,000 mg by mouth every 6 (six) hours as needed for mild pain or headache.   Yes [provider]  Carboxymethylcellulose Sodium (REFRESH TEARS OP) Apply 1 drop to eye daily as needed (dry eyes).   Yes [provider]  FIBER ADULT GUMMIES PO Take 3 tablets by mouth daily.   Yes [provider]  Multiple Vitamins-Minerals (ADULT ONE DAILY GUMMIES PO) Take 2 tablets by mouth daily.   Yes [provider]    Allergies    Adhesive [tape], Heparin, Other, Pineapple, [place], and Tomato  Review of Systems   Review of Systems  Constitutional:  Negative for activity change, chills, fatigue and fever.  HENT:  Negative for congestion, sinus pressure, sinus pain, sore throat and trouble swallowing.   Respiratory:  Positive for chest tightness and shortness of breath. Negative for cough.   Cardiovascular:  Positive for chest pain and palpitations. Negative for leg  swelling.  Gastrointestinal:  Negative for abdominal pain, constipation, diarrhea, nausea and vomiting.  Genitourinary:  Negative for dysuria, flank pain, frequency, menstrual problem, urgency, vaginal bleeding and vaginal pain.  Musculoskeletal:  Negative for back pain, myalgias, neck pain and neck stiffness.  Skin:  Negative for rash.  Allergic/Immunologic: Negative for immunocompromised state.  Neurological:  Positive for light-headedness. Negative for dizziness, seizures, syncope, speech difficulty, weakness, numbness and headaches.  Psychiatric/Behavioral:  Negative for confusion.    Physical Exam Updated Vital Signs BP 117/72   Pulse 79   Temp 98.5 F (36.9 C) (Oral)   Resp 15   Ht '5\' 2"'$  (1.575 m)   Wt 68.5 kg   LMP 11/22/2020   SpO2 100%   BMI 27.62 kg/m   Physical Exam Vitals and nursing note reviewed.  Constitutional:      General: She is not in acute distress.    Appearance: She is not ill-appearing, toxic-appearing or diaphoretic.  HENT:     Head: Normocephalic.     Mouth/Throat:     Mouth: Mucous membranes are moist.  Eyes:     Conjunctiva/sclera: Conjunctivae normal.  Cardiovascular:     Rate and Rhythm: Normal rate and regular rhythm.     Pulses: Normal pulses.     Heart sounds: Normal heart sounds. No murmur heard.   No friction rub. No gallop.  Pulmonary:     Effort: Pulmonary effort is normal. No respiratory distress.     Breath sounds: Normal breath sounds. No stridor. No wheezing, rhonchi or rales.  Chest:     Chest wall: No tenderness.  Abdominal:     General: There is no distension.     Palpations: Abdomen is soft. There is no mass.     Tenderness: There is no abdominal tenderness. There is no right CVA tenderness, left CVA tenderness, guarding or rebound.     Hernia: No hernia is present.  Musculoskeletal:     Cervical back: Neck supple.     Right lower leg: No edema.     Left lower leg: No edema.  Skin:    General: Skin is warm.      Capillary Refill: Capillary refill takes less than 2 seconds.     Coloration: Skin is not jaundiced or pale.     Findings: No rash.  Neurological:     General: No focal deficit present.     Mental Status: She is alert.  Psychiatric:        Behavior: Behavior normal.    ED Results / Procedures /  Treatments   Labs (all labs ordered are listed, but only abnormal results are displayed) Labs Reviewed  CBC - Abnormal; Notable for the following components:      Result Value   Hemoglobin 11.6 (*)    All other components within normal limits  URINALYSIS, ROUTINE W REFLEX MICROSCOPIC - Abnormal; Notable for the following components:   APPearance HAZY (*)    Ketones, ur 5 (*)    Leukocytes,Ua TRACE (*)    All other components within normal limits  BASIC METABOLIC PANEL  I-STAT BETA HCG BLOOD, ED ([facility], [facility], [facility] ONLY)  TROPONIN I (HIGH SENSITIVITY)  TROPONIN I (HIGH SENSITIVITY)    EKG EKG Interpretation  Date/Time:  'Sunday December 23 2020 19:47:46 EDT Ventricular Rate:  98 PR Interval:  120 QRS Duration: 68 QT Interval:  318 QTC Calculation: 405 R Axis:   65 Text Interpretation: Normal sinus rhythm Normal ECG Confirmed by Dykstra, Richard ([id number]) on 12/23/2020 10:24:34 PM  Radiology DG Chest 2 View  Result Date: 12/23/2020 CLINICAL DATA:  Chest pain, tachycardia EXAM: CHEST - 2 VIEW COMPARISON:  None. FINDINGS: The heart size and mediastinal contours are within normal limits. Both lungs are clear. The visualized skeletal structures are unremarkable. IMPRESSION: Normal study. Electronically Signed   By: Kevin  Dover M.D.   On: 12/23/2020 20:30   CT Angio Chest PE W and/or Wo Contrast  Result Date: 12/24/2020 CLINICAL DATA:  Chest pain EXAM: CT ANGIOGRAPHY CHEST WITH CONTRAST TECHNIQUE: Multidetector CT imaging of the chest was performed using the standard protocol during bolus administration of intravenous contrast. Multiplanar CT image reconstructions and MIPs were obtained to evaluate  the vascular anatomy. CONTRAST:  50mL OMNIPAQUE IOHEXOL 350 MG/ML SOLN COMPARISON:  None. FINDINGS: Cardiovascular: No filling defects in the pulmonary arteries to suggest pulmonary emboli. Heart is normal size. Aorta is normal caliber. Mediastinum/Nodes: No mediastinal, hilar, or axillary adenopathy. Trachea and esophagus are unremarkable. Thyroid unremarkable. Lungs/Pleura: Lungs are clear. No focal airspace opacities or suspicious nodules. No effusions. Upper Abdomen: Imaging into the upper abdomen demonstrates no acute findings. Musculoskeletal: Chest wall soft tissues are unremarkable. No acute bony abnormality. Review of the MIP images confirms the above findings. IMPRESSION: No evidence of pulmonary embolus. No acute cardiopulmonary disease. Electronically Signed   By: Kevin  Dover M.D.   On: 12/24/2020 01:17    Procedures Procedures   Medications Ordered in ED Medications  sodium chloride 0.9 % bolus 1,000 mL (0 mLs Intravenous Stopped 12/24/20 0245)  iohexol (OMNIPAQUE) 350 MG/ML injection 50 mL (50 mLs Intravenous Contrast Given 12/24/20 0114)    ED Course  I have reviewed the triage vital signs and the nursing notes.  Pertinent labs & imaging results that were available during my care of the patient were reviewed by me and considered in my medical decision making (see chart for details).    MDM Rules/Calculators/A&P                           40'$  year old female with a history of POTS, and endometriosis who presents emergency department with tachycardia that has been intermittent since yesterday.  Her smart watch has notified her that her heart rate has gone up to as high as 156 bpm.  She reports associated chest pain, shortness of breath, lightheadedness, and palpitations.  No documented tachycardia in the ED.  No hypoxia.  She is normotensive.  Afebrile.  Physical exam is reassuring.  However, given that she has been having  tachycardia and her biological mother died from an unprovoked  PE at the age of 72 we will order PE study.  She did have orthostatic vital signs that were positive as she was lightheaded and had almost a 20 point drop in her systolic pressure.  We will give a fluid bolus.  Could consider runs of SVT or an arrhythmia.  She has been advised to let the nurse know if she develops another episode of tachycardia.  Labs are otherwise unremarkable.  EKG was sinus rhythm.  Delta troponin is not elevated.  Hemoglobin is 11.6, but stable from previous.  Negative pregnancy test.  No metabolic derangements.  CTA is unremarkable.  No evidence of PE.  Orthostatic vital signs were repeated and were negative.  Patient was ambulated and had no dizziness or lightheadedness.  She has had no episodes of tachycardia since she has been under my care.  She is asymptomatic at this time.  Doubt hyperthyroidism, hypertensive crisis, PE, sepsis, ACS, aortic dissection, esophageal rupture.  We will give the patient a referral to cardiology for POTS.  She may also require a Zio patch if she continues to have remittent episodes after she has been hydrated as she did have small ketonuria on her urinalysis, which does suggest that patient had some dehydration.  ER return precautions given.  She is hemodynamically stable in no acute distress.  Safer discharge home with outpatient follow-up as discussed.  Final Clinical Impression(s) / ED Diagnoses Final diagnoses:  Tachycardia  Orthostatic hypotension    Rx / DC Orders ED Discharge Orders     None        Dianara Smullen A, PA-C 12/24/20 0323    Blanchie Dessert, MD 12/24/20 [phone]

## 2020-12-26 DIAGNOSIS — N803 Endometriosis of pelvic peritoneum: Secondary | ICD-10-CM | POA: Diagnosis not present

## 2020-12-26 DIAGNOSIS — Z01419 Encounter for gynecological examination (general) (routine) without abnormal findings: Secondary | ICD-10-CM | POA: Diagnosis not present

## 2020-12-26 DIAGNOSIS — Z6826 Body mass index (BMI) 26.0-26.9, adult: Secondary | ICD-10-CM | POA: Diagnosis not present

## 2020-12-26 DIAGNOSIS — N898 Other specified noninflammatory disorders of vagina: Secondary | ICD-10-CM | POA: Diagnosis not present

## 2020-12-26 DIAGNOSIS — N760 Acute vaginitis: Secondary | ICD-10-CM | POA: Diagnosis not present

## 2021-02-14 DIAGNOSIS — Z1231 Encounter for screening mammogram for malignant neoplasm of breast: Secondary | ICD-10-CM | POA: Diagnosis not present

## 2021-11-26 IMAGING — CT CT HEAD W/O CM
3 series · 15 of 47 positions shown, 18 images · non-contrast
Comparison: None.

CLINICAL DATA: Facial injury after fall.

EXAM:
CT HEAD WITHOUT CONTRAST
CT MAXILLOFACIAL WITHOUT CONTRAST
TECHNIQUE: Multidetector CT imaging of the head and maxillofacial structures
were performed using the standard protocol without intravenous
contrast. Multiplanar CT image reconstructions of the maxillofacial
structures were also generated.

[Series 3: head 5.0 h30s · axial · 0.44mm/px · z∈[+1367,+1497]mm · 9 of 32 slices shown, 12 images]
[im 3/32  brain]
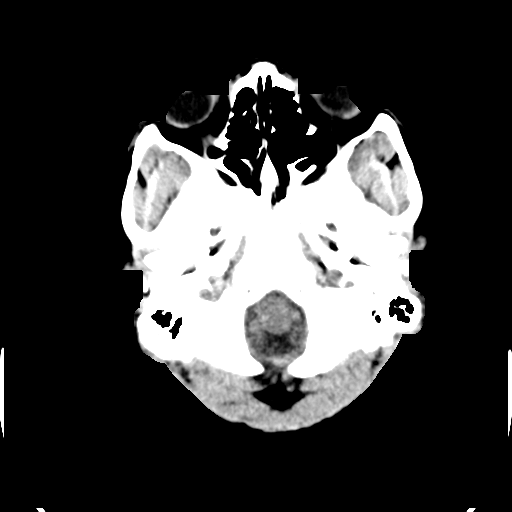
[im 3/32  bone]
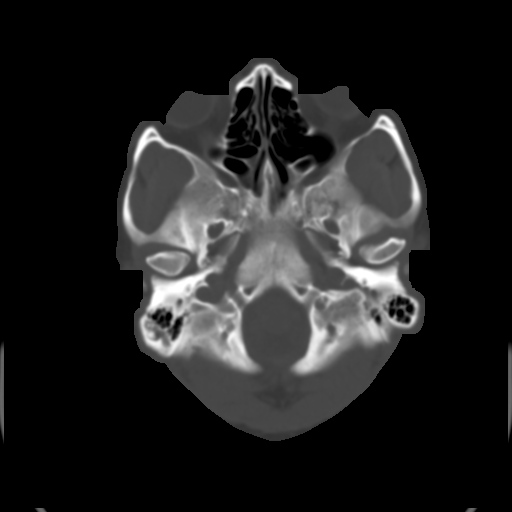
[im 6/32  brain]
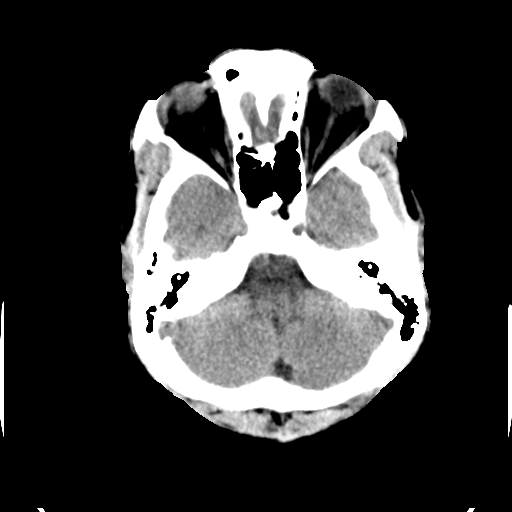
[im 9/32  brain]
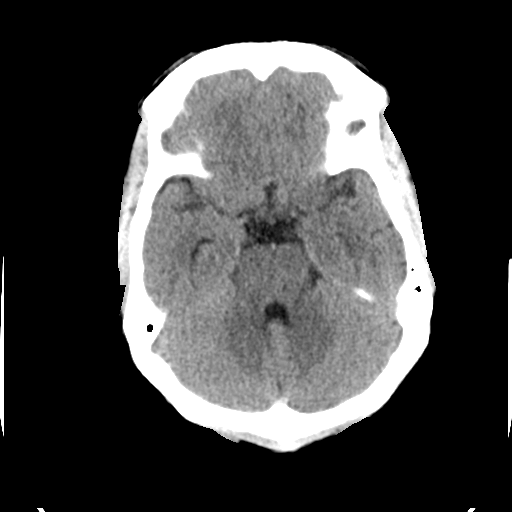
[im 12/32  brain]
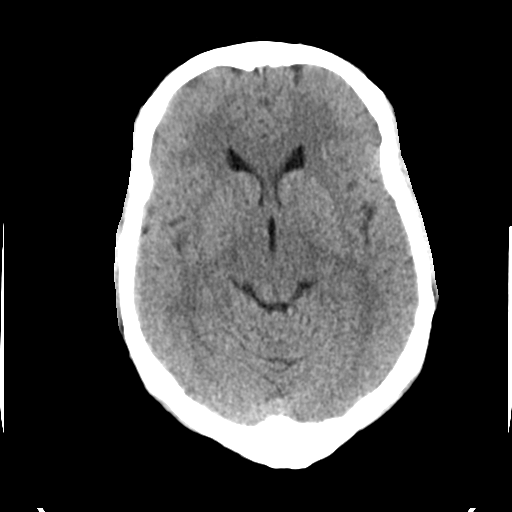
[im 17/32  brain]
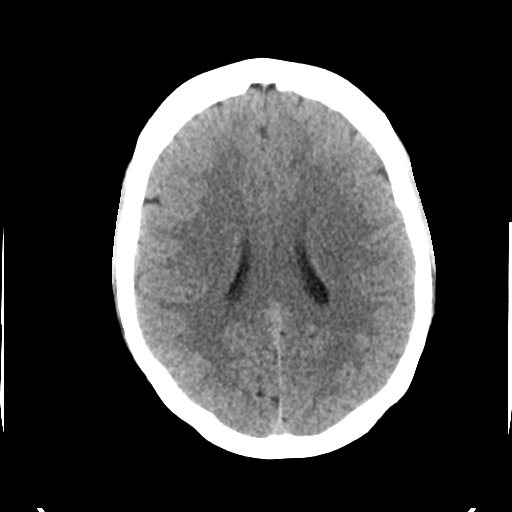
[im 17/32  bone]
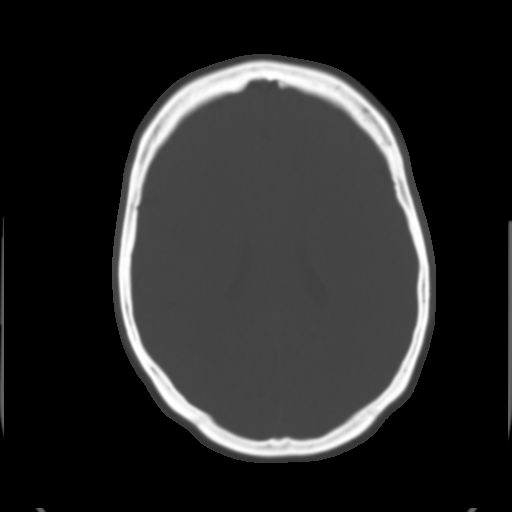
[im 20/32  brain]
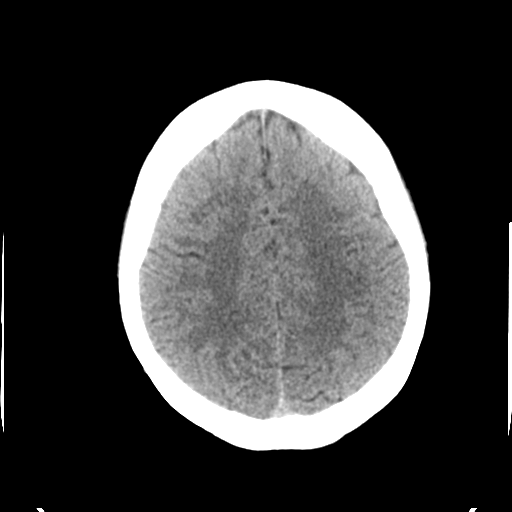
[im 23/32  brain]
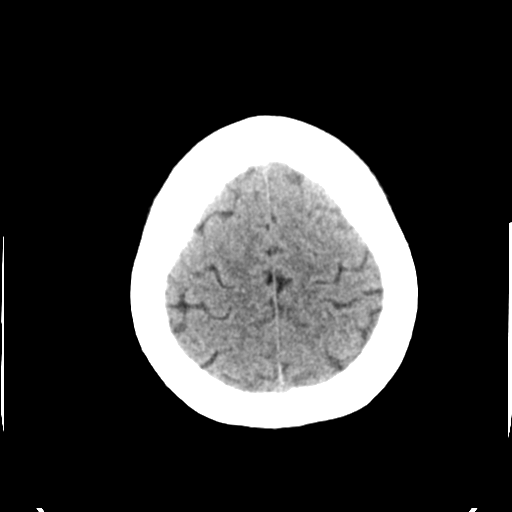
[im 26/32  brain]
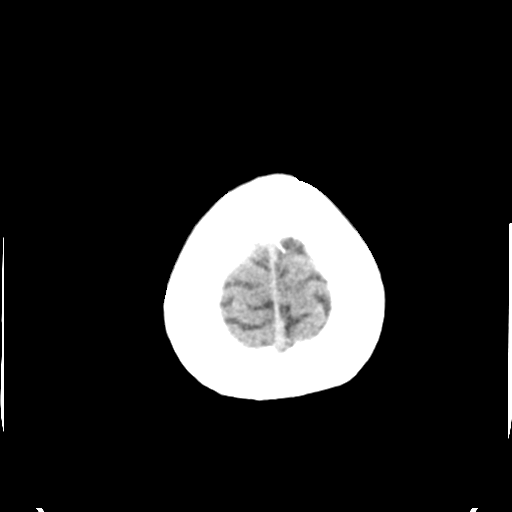
[im 29/32  brain]
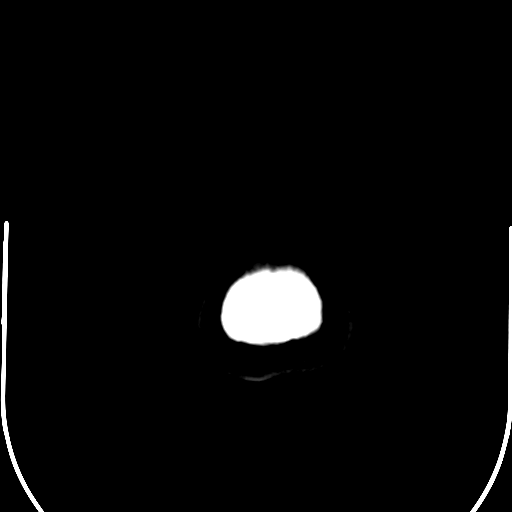
[im 29/32  bone]
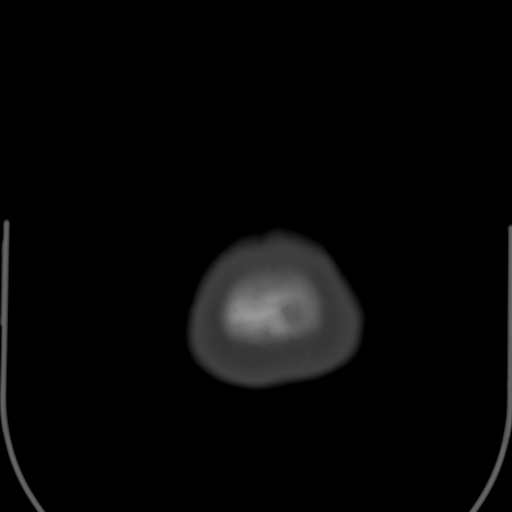

[Series 5: head 3.0 mpr cor · coronal · 0.29mm/px · 3 of 67 slices shown]
[im 23/67  brain]
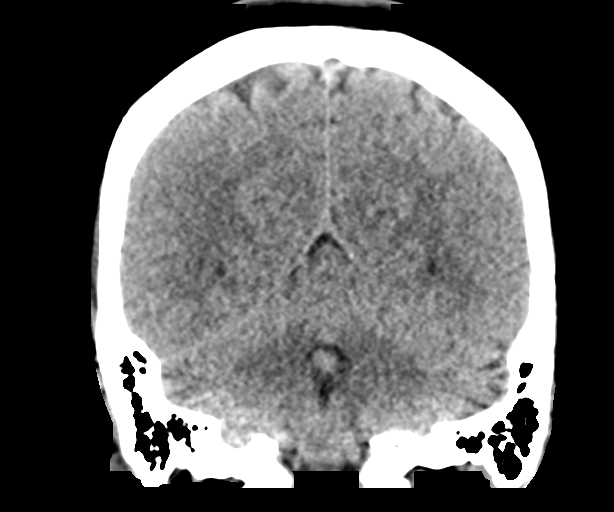
[im 30/67  brain]
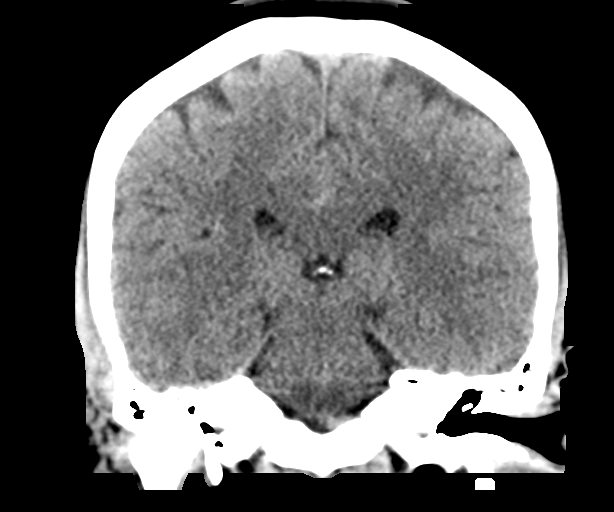
[im 37/67  brain]
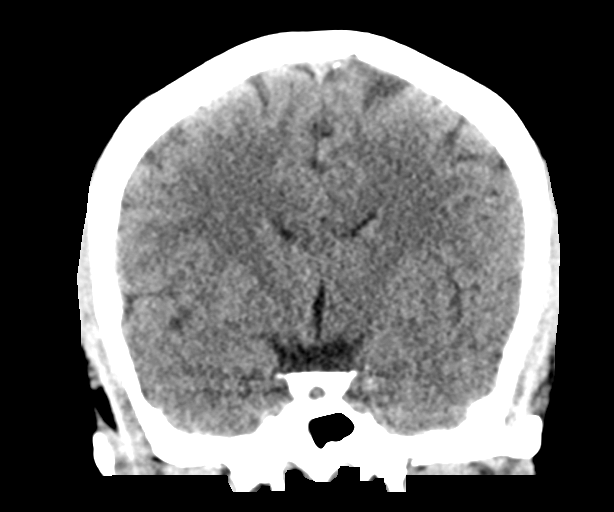

[Series 6: head 3.0 mpr sag · sagittal · 0.30mm/px · 3 of 54 slices shown]
[im 18/54  brain]
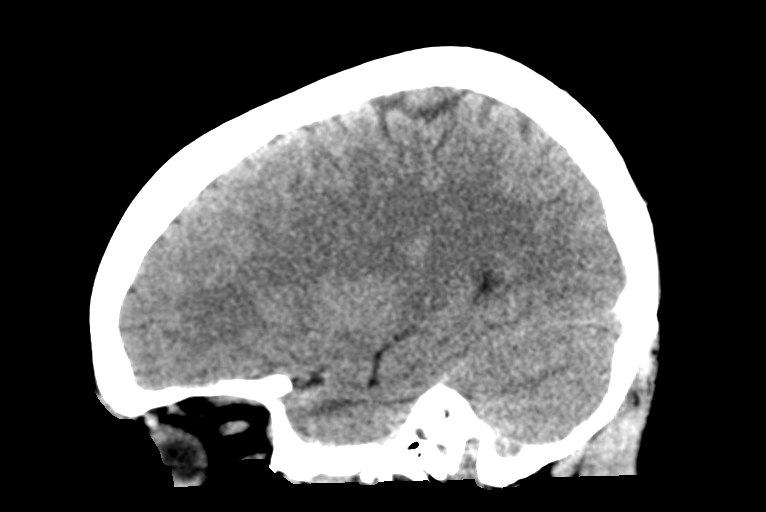
[im 27/54  brain]
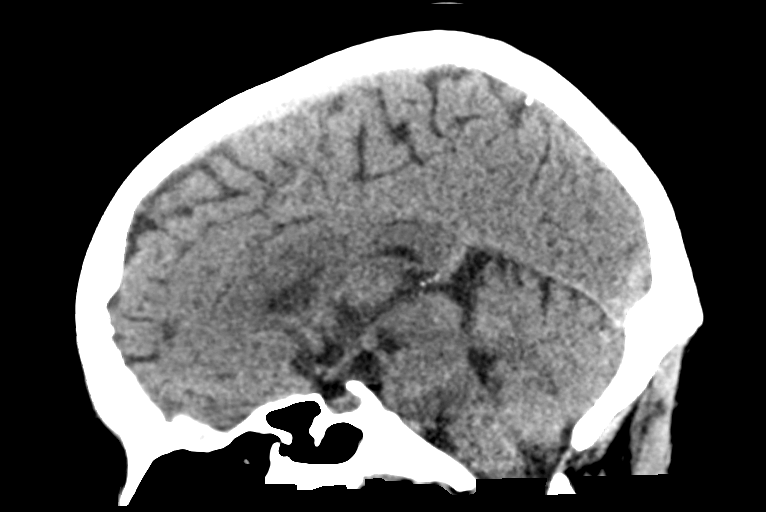
[im 36/54  brain]
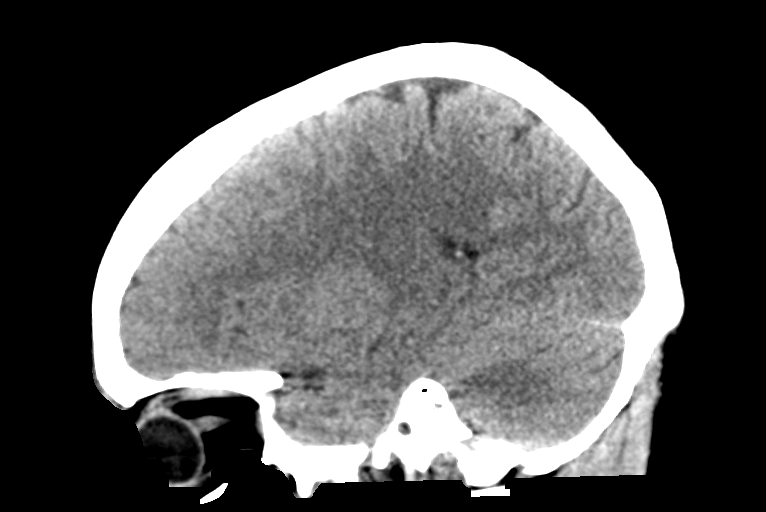

[15 of 47 positions shown; findings below may reference images not displayed]

FINDINGS: CT HEAD FINDINGS

Brain: No evidence of acute infarction, hemorrhage, hydrocephalus,
extra-axial collection or mass lesion/mass effect.

Vascular: No hyperdense vessel or unexpected calcification.

Skull: Normal. Negative for fracture or focal lesion.

Other: None.

CT MAXILLOFACIAL FINDINGS

Osseous: No fracture or mandibular dislocation. No destructive
process.

Orbits: Negative. No traumatic or inflammatory finding.

Sinuses: Clear.

Soft tissues: Negative.
IMPRESSION: 1. Normal head CT.
2. No abnormality seen in maxillofacial region.

## 2021-11-26 IMAGING — CT CT MAXILLOFACIAL W/O CM
3 series · 15 of 47 positions shown, 18 images · non-contrast
Comparison: None.

CLINICAL DATA: Facial injury after fall.

EXAM:
CT HEAD WITHOUT CONTRAST
CT MAXILLOFACIAL WITHOUT CONTRAST
TECHNIQUE: Multidetector CT imaging of the head and maxillofacial structures
were performed using the standard protocol without intravenous
contrast. Multiplanar CT image reconstructions of the maxillofacial
structures were also generated.

[Series 3: facial/ orbits 2.0 h30s · axial · 0.34mm/px · z∈[+1267,+1379]mm · 9 of 66 slices shown, 12 images]
[im 5/66  brain]
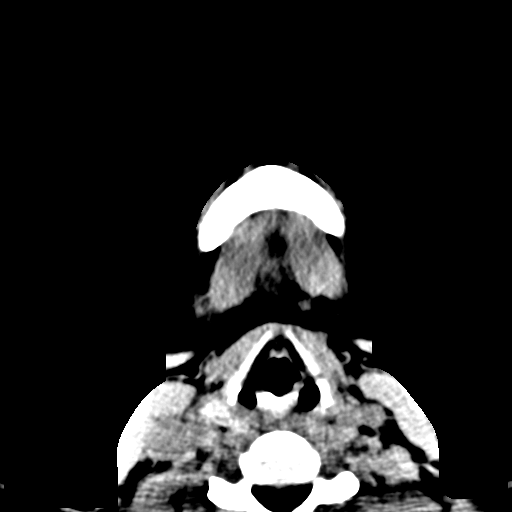
[im 5/66  bone]
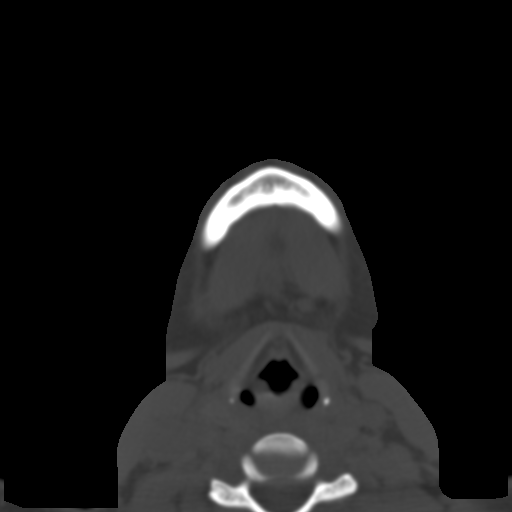
[im 12/66  bone]
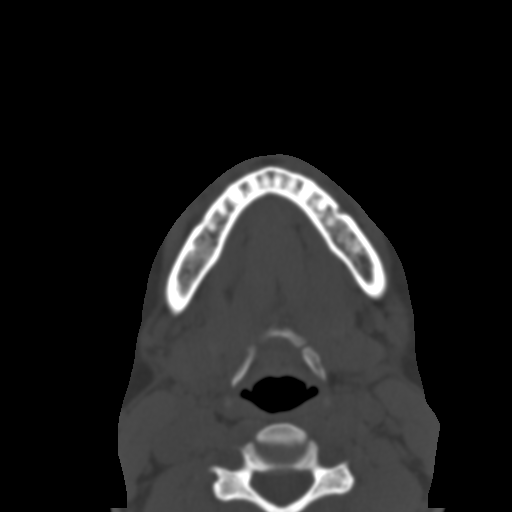
[im 18/66  bone]
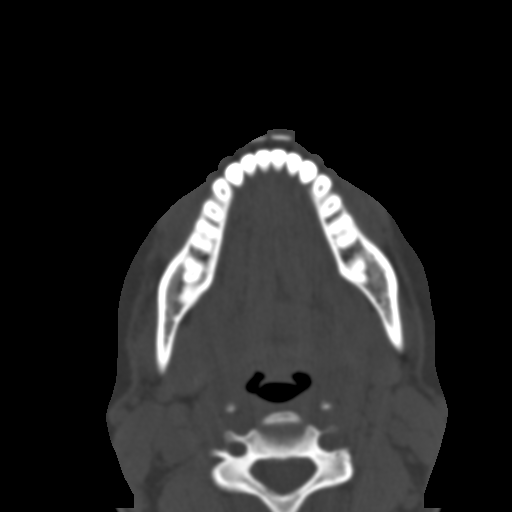
[im 25/66  bone]
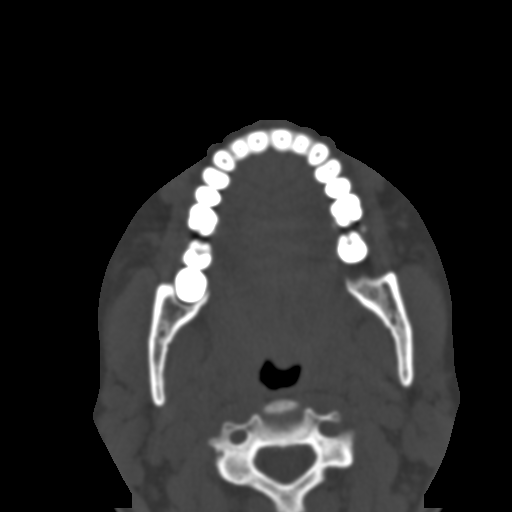
[im 34/66  brain]
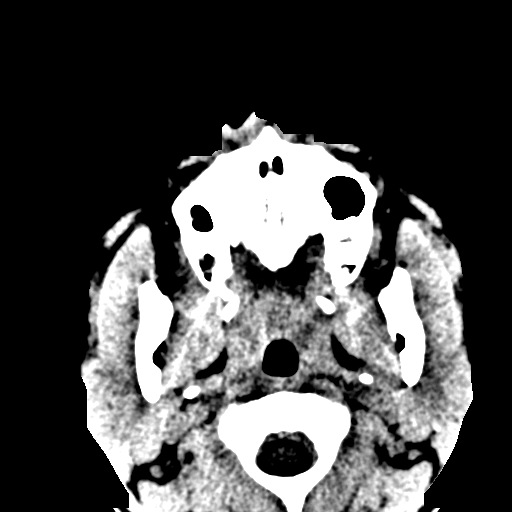
[im 34/66  bone]
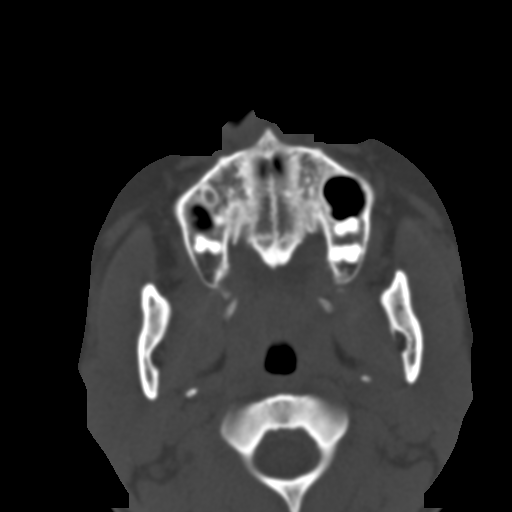
[im 41/66  bone]
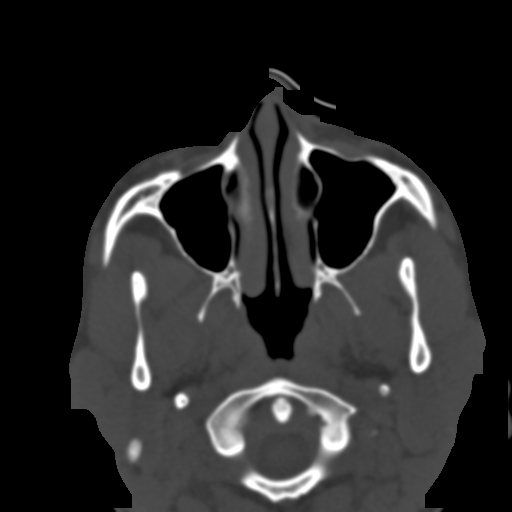
[im 48/66  bone]
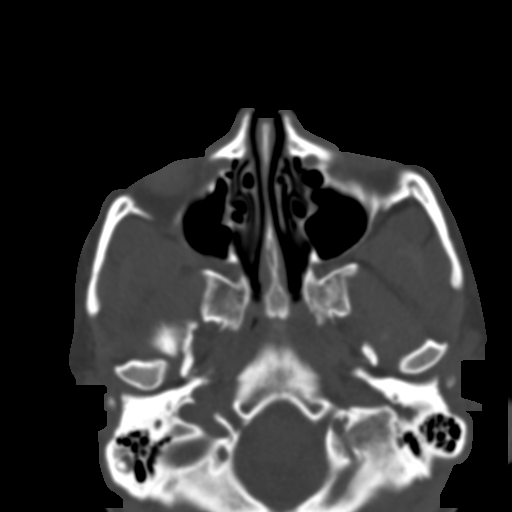
[im 54/66  bone]
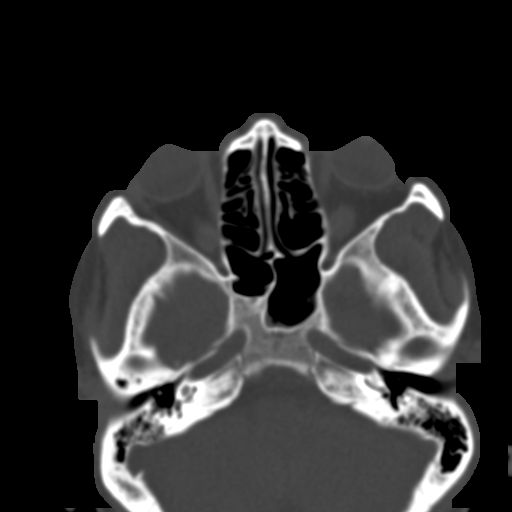
[im 61/66  brain]
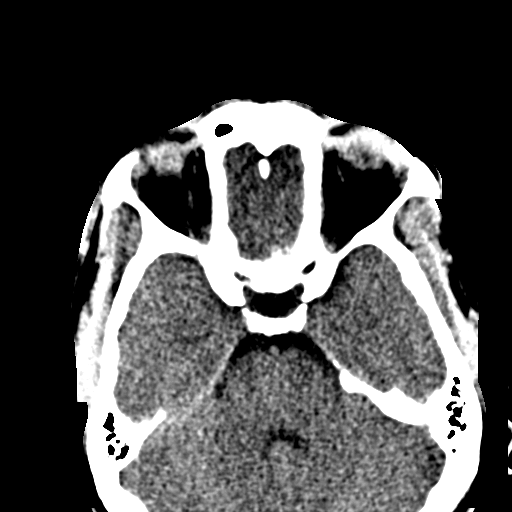
[im 61/66  bone]
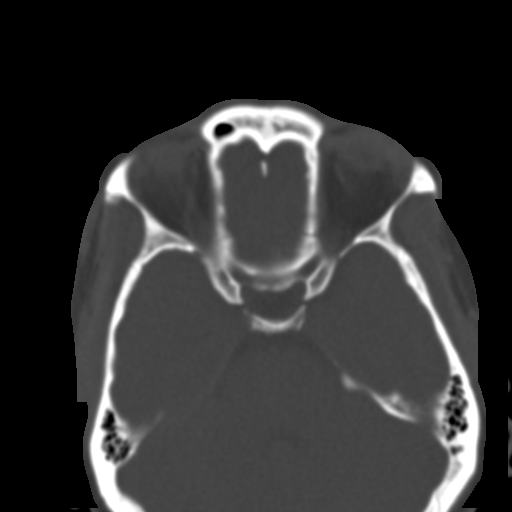

[Series 7: coronal soft tissue · coronal · 0.29mm/px · 3 of 76 slices shown]
[im 26/76  bone]
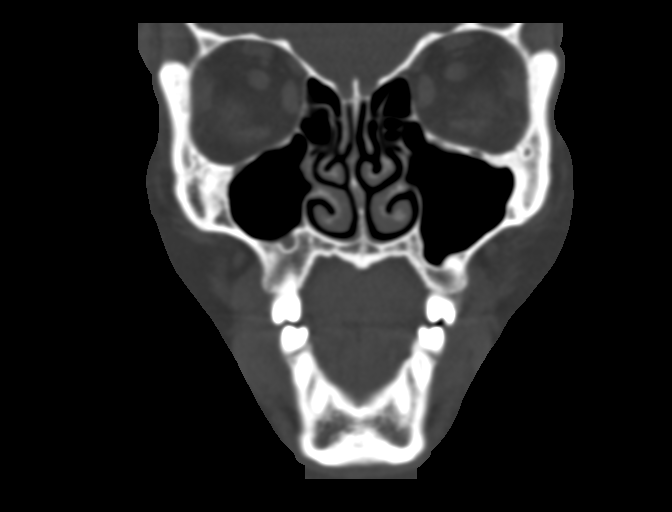
[im 34/76  bone]
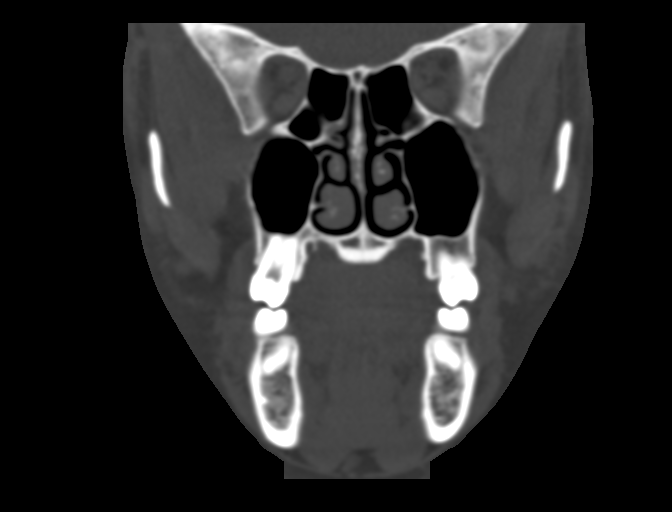
[im 42/76  bone]
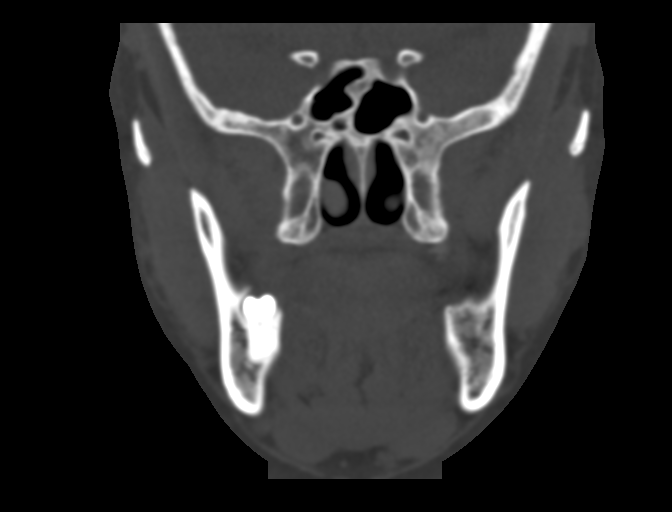

[Series 8: sagittal soft tissue · sagittal · 0.29mm/px · 3 of 81 slices shown]
[im 27/81  bone]
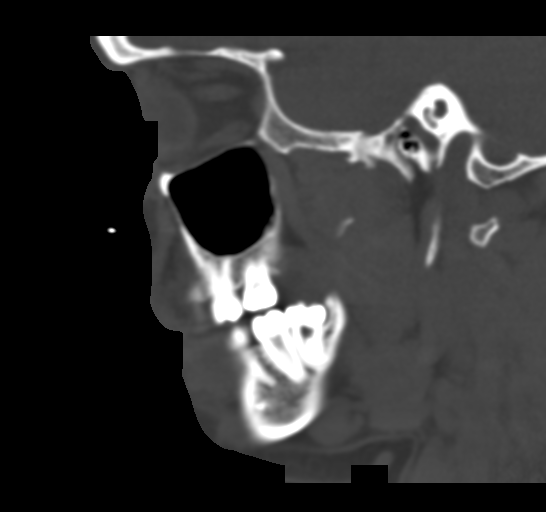
[im 41/81  bone]
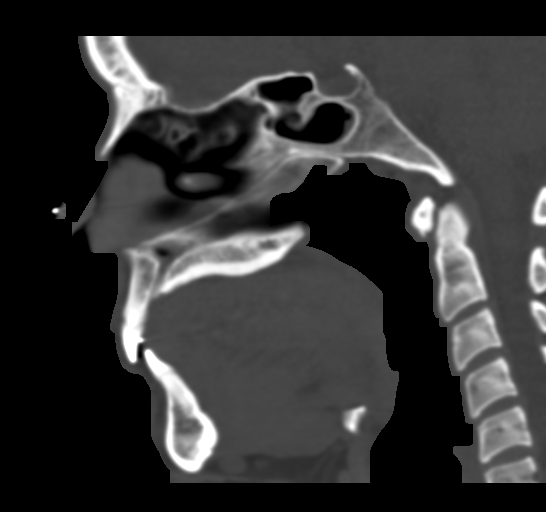
[im 54/81  bone]
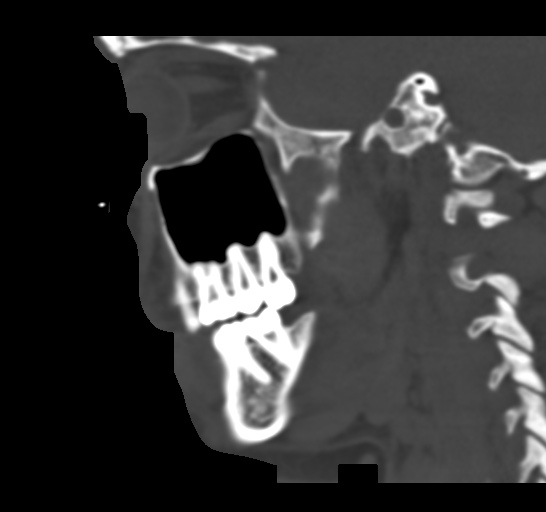

[15 of 47 positions shown; findings below may reference images not displayed]

FINDINGS: CT HEAD FINDINGS

Brain: No evidence of acute infarction, hemorrhage, hydrocephalus,
extra-axial collection or mass lesion/mass effect.

Vascular: No hyperdense vessel or unexpected calcification.

Skull: Normal. Negative for fracture or focal lesion.

Other: None.

CT MAXILLOFACIAL FINDINGS

Osseous: No fracture or mandibular dislocation. No destructive
process.

Orbits: Negative. No traumatic or inflammatory finding.

Sinuses: Clear.

Soft tissues: Negative.
IMPRESSION: 1. Normal head CT.
2. No abnormality seen in maxillofacial region.
